# Patient Record
Sex: Male | Born: 1990 | Race: Black or African American | Hispanic: No | Marital: Single | Smoking: Never smoker
Health system: Southern US, Community
[De-identification: ages and names within clinical notes are randomized; demographics above are authoritative.]

## PROBLEM LIST (undated history)

## (undated) DIAGNOSIS — G8220 Paraplegia, unspecified: Secondary | ICD-10-CM

## (undated) DIAGNOSIS — S82899A Other fracture of unspecified lower leg, initial encounter for closed fracture: Secondary | ICD-10-CM

## (undated) DIAGNOSIS — J45909 Unspecified asthma, uncomplicated: Secondary | ICD-10-CM

## (undated) DIAGNOSIS — I749 Embolism and thrombosis of unspecified artery: Secondary | ICD-10-CM

## (undated) HISTORY — PX: NO PAST SURGERIES: SHX2092

## (undated) HISTORY — DX: Paraplegia, unspecified: G82.20

## (undated) HISTORY — DX: Unspecified asthma, uncomplicated: J45.909

## (undated) HISTORY — DX: Embolism and thrombosis of unspecified artery: I74.9

## (undated) HISTORY — DX: Other fracture of unspecified lower leg, initial encounter for closed fracture: S82.899A

---
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
## 2011-09-18 ENCOUNTER — Ambulatory Visit: Payer: Self-pay | Admitting: Rehabilitative and Restorative Service Providers"

## 2011-09-22 ENCOUNTER — Ambulatory Visit (INDEPENDENT_AMBULATORY_CARE_PROVIDER_SITE_OTHER): Payer: Medicaid Other | Admitting: Internal Medicine

## 2011-09-22 ENCOUNTER — Encounter: Payer: Self-pay | Admitting: Internal Medicine

## 2011-09-22 VITALS — BP 126/70 | HR 96 | Temp 98.0°F | Resp 16

## 2011-09-22 DIAGNOSIS — D6859 Other primary thrombophilia: Secondary | ICD-10-CM

## 2011-09-22 DIAGNOSIS — N319 Neuromuscular dysfunction of bladder, unspecified: Secondary | ICD-10-CM

## 2011-09-22 DIAGNOSIS — K592 Neurogenic bowel, not elsewhere classified: Secondary | ICD-10-CM

## 2011-09-22 DIAGNOSIS — G8220 Paraplegia, unspecified: Secondary | ICD-10-CM

## 2011-09-22 DIAGNOSIS — G08 Intracranial and intraspinal phlebitis and thrombophlebitis: Secondary | ICD-10-CM

## 2011-09-22 NOTE — Patient Instructions (Addendum)
Call or return to clinic prn if these symptoms worsen or fail to improve as anticipated.  Please call for a followup office visit in 3 weeks if Medicaid will allow this practice to accept you as a chronic patient

## 2011-09-22 NOTE — Progress Notes (Signed)
Subjective:    Patient ID: Donald Harvey, male    DOB: June 21, 1991, 20 y.o.   MRN: [id number]  HPI  20 year old patient who is seen today to establish with our practice. The patient was in his usual state of excellent health until September 7 when he noticed weakness and numbness involving his lower extremities. 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 At that time he underwent an MRI of the spinal cord that was suggestive of transverse myelitis. He was ultimately transferred to [facility] where he received high-dose steroids for 4 days. He continued to deteriorate with further loss of strength. Evaluation also included an extensive rheumatological panel that was normal;  he underwent plasmapheresis and a followup MRI showed progression of his spinal cord lesion that extended from T1-T11;  at that time a spinal cord vasculopathy was raised and a spinal arteriogram did not show any AV malformations. A CTA of the of the brain however did confirm a right-sided nonocclusive thrombus in the transverse and sphenoid sinus areas and a primary coagulopathy was then considered; an extensive hypercoagulable workup was largely nonrevealing except for a low plasminogen level elevated thrombin clot time and low fibrinogen level;  a hematology workup was negative for malignancy;  this included a testicular ultrasound and a full-body PET scan. The patient was placed on Coumadin anticoagulation on September 15;  evaluation also revealed a transthoracic echo that showed no evidence of thrombi;  he was later  transferred to the [facility]. On September 28 he was readmitted to the acute care hospital due to urosepsis. Blood cultures were positive for Klebsiella. Last week he was discharged from a rehabilitation and [place] and has been living with his mother and a 1 floor apartment here in [place]. He has been on  Coumadin anticoagulation at a dose of 6 mg daily with a therapeutic INR.  Past medical history is otherwise unremarkable  Social history. Prior to his acute illness he was enrolled full-time as a Archivist in [place] no tobacco use and only social alcohol use. Presently lives in a first floor apartment with his mother  Family history multiple family members on his father's side have had clotting issues and multiple family members on blood thinners.  Functional history. At the present time he has only minimal movement of the right leg. He has a T6 sensory level that is improving modestly. He is able to transfer from a wheelchair to bed without difficulty. He does not ambulate. He does require in and out self-catheterization 4 times daily. He has a neurogenic bowel and requires nightly suppositories   Review of Systems  Constitutional: Negative for fever, chills, appetite change and fatigue.  HENT: Negative for hearing loss, ear pain, congestion, sore throat, trouble swallowing, neck stiffness, dental problem, voice change and tinnitus.   Eyes: Negative for pain, discharge and visual disturbance.  Respiratory: Negative for cough, chest tightness, wheezing and stridor.   Cardiovascular: Negative for chest pain, palpitations and leg swelling.  Gastrointestinal: Negative for nausea, vomiting, abdominal pain, diarrhea, constipation, blood in stool and abdominal distention.  Genitourinary: Negative for urgency, hematuria, flank pain, discharge, difficulty urinating and genital sores.  Musculoskeletal: Positive for gait problem. Negative for myalgias, back pain, joint swelling and arthralgias.  Skin: Negative for rash.  Neurological: Negative for dizziness, syncope, speech difficulty, weakness, numbness and headaches.       Paraplegia with T6 sensory level. Minimal  movement right leg only Neurogenic bowel and bladder  Hematological: Negative for adenopathy. Does not bruise/bleed easily.    Psychiatric/Behavioral: Negative for behavioral problems and dysphoric mood. The patient is not nervous/anxious.        Objective:   Physical Exam  Constitutional: He is oriented to person, place, and time. He appears well-developed.       Wheelchair bound  HENT:  Head: Normocephalic.  Right Ear: External ear normal.  Left Ear: External ear normal.  Eyes: Conjunctivae and EOM are normal.  Neck: Normal range of motion.  Cardiovascular: Normal rate and normal heart sounds.   Pulmonary/Chest: Breath sounds normal.  Abdominal: Bowel sounds are normal.  Musculoskeletal: Normal range of motion. He exhibits no edema and no tenderness.  Neurological: He is alert and oriented to person, place, and time.       T6 sensory level Paraplegia with minimal movement right leg   Psychiatric: He has a normal mood and affect. His behavior is normal.        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 We'll continue Coumadin anticoagulation Neurogenic bowel and bladder. We'll continue in and out self bladder catheterizations and nightly suppositorie. He will consider a condom catheter Status post urosepsis  After further investigation it was determined that another practice has been assigned for this individual's care. Apparently this practice will be unable to accept this individual as our patient. The patient and his mother were told that if this could be changed will happily see the patient back at this practice. At the present time it appears this will be a one time visit only.  Will need INR checked in 3 weeks

## 2011-09-28 ENCOUNTER — Ambulatory Visit: Payer: Self-pay | Admitting: Internal Medicine

## 2011-09-30 ENCOUNTER — Ambulatory Visit: Payer: Medicaid Other | Attending: Physical Medicine and Rehabilitation | Admitting: Physical Therapy

## 2011-09-30 DIAGNOSIS — M6281 Muscle weakness (generalized): Secondary | ICD-10-CM | POA: Insufficient documentation

## 2011-09-30 DIAGNOSIS — IMO0001 Reserved for inherently not codable concepts without codable children: Secondary | ICD-10-CM | POA: Insufficient documentation

## 2011-09-30 DIAGNOSIS — R269 Unspecified abnormalities of gait and mobility: Secondary | ICD-10-CM | POA: Insufficient documentation

## 2011-10-05 ENCOUNTER — Ambulatory Visit: Payer: Self-pay | Admitting: Internal Medicine

## 2011-10-08 ENCOUNTER — Ambulatory Visit: Payer: Medicaid Other | Admitting: Physical Therapy

## 2011-10-12 ENCOUNTER — Ambulatory Visit: Payer: Medicaid Other | Attending: Physical Medicine and Rehabilitation | Admitting: Physical Therapy

## 2011-10-12 DIAGNOSIS — R269 Unspecified abnormalities of gait and mobility: Secondary | ICD-10-CM | POA: Insufficient documentation

## 2011-10-12 DIAGNOSIS — IMO0001 Reserved for inherently not codable concepts without codable children: Secondary | ICD-10-CM | POA: Insufficient documentation

## 2011-10-12 DIAGNOSIS — M6281 Muscle weakness (generalized): Secondary | ICD-10-CM | POA: Insufficient documentation

## 2011-10-14 ENCOUNTER — Ambulatory Visit: Payer: Medicaid Other | Admitting: Physical Therapy

## 2011-10-16 ENCOUNTER — Ambulatory Visit: Payer: Medicaid Other | Admitting: Physical Therapy

## 2011-10-19 ENCOUNTER — Ambulatory Visit: Payer: Medicaid Other | Admitting: Physical Therapy

## 2011-10-21 ENCOUNTER — Ambulatory Visit: Payer: Medicaid Other | Admitting: Physical Therapy

## 2011-10-23 ENCOUNTER — Ambulatory Visit: Payer: Medicaid Other | Admitting: Physical Therapy

## 2011-10-28 ENCOUNTER — Ambulatory Visit: Payer: Medicaid Other | Admitting: Physical Therapy

## 2011-10-29 ENCOUNTER — Ambulatory Visit: Payer: Medicaid Other | Admitting: Physical Therapy

## 2011-10-30 ENCOUNTER — Ambulatory Visit: Payer: Medicaid Other | Admitting: Physical Therapy

## 2011-11-10 ENCOUNTER — Telehealth: Payer: Self-pay | Admitting: Internal Medicine

## 2011-11-10 NOTE — Telephone Encounter (Signed)
Letter faxed and mother aware. 

## 2011-11-10 NOTE — Telephone Encounter (Signed)
All ok

## 2011-11-10 NOTE — Telephone Encounter (Signed)
Whittaker's mom is requesting a Disability Letter from Dr. Amador Cunas  to state that Amen is disabled - fax letter to Ms. John Giovanni [phone]. Also requesting a referral for physical therapy for Jham[place] in [place] (Dagon goes to school in [place]).

## 2012-05-31 ENCOUNTER — Telehealth: Payer: Self-pay

## 2012-05-31 NOTE — Telephone Encounter (Signed)
I called the pt mother and she states the pt will be leaving for school on 06-18-12 so he will need an appt before this date. Please advise. Carron Curie, CMA

## 2012-05-31 NOTE — Telephone Encounter (Signed)
Appt set. Pt mother is aware.Carron Curie, CMA

## 2012-05-31 NOTE — Telephone Encounter (Signed)
Monday 06-20-12 at 11am for 1115am allergy consult.

## 2012-05-31 NOTE — Telephone Encounter (Signed)
Please have patient come in on Monday 06-13-12 at 11am for 1115am allergy consult. Thanks.

## 2012-06-13 ENCOUNTER — Other Ambulatory Visit: Payer: Medicaid Other

## 2012-06-13 ENCOUNTER — Encounter: Payer: Self-pay | Admitting: Internal Medicine

## 2012-06-13 ENCOUNTER — Ambulatory Visit (INDEPENDENT_AMBULATORY_CARE_PROVIDER_SITE_OTHER): Payer: Medicaid Other | Admitting: Internal Medicine

## 2012-06-13 VITALS — BP 128/70 | HR 90 | Ht 74.0 in

## 2012-06-13 DIAGNOSIS — Z91018 Allergy to other foods: Secondary | ICD-10-CM

## 2012-06-13 DIAGNOSIS — D6859 Other primary thrombophilia: Secondary | ICD-10-CM

## 2012-06-13 DIAGNOSIS — J309 Allergic rhinitis, unspecified: Secondary | ICD-10-CM

## 2012-06-13 DIAGNOSIS — T781XXA Other adverse food reactions, not elsewhere classified, initial encounter: Secondary | ICD-10-CM

## 2012-06-13 NOTE — Progress Notes (Signed)
06/13/12- 21 yoM  Never smoker, paraplegic, w/ hx thrombosis spinal cord/ coumadin.  Coming now for allergy evaluation. Self referral-never had skin test or allergy lab work-college student. No PCP- uses urgent care on [address]. Complains of occasional lightheadedness, perennial, since he was a child. Not a family trait. He can't relate this to any specific exposure. Diagnosed with asthma as a child but never hospitalized. Denies nasal congestion, itching or sneezing, watery eyes or wheezing. Denies skin rash. Shellfish may cause headache and rash. 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  Prior to Admission medications   Medication Sig Start Date End Date Taking? Authorizing Provider  warfarin (COUMADIN) 5 MG tablet Take as directed per clinic on [address]   Yes Historical Provider, MD   Past Medical History  Diagnosis Date  . Childhood asthma   . Embolism - blood clot   . Spinal paraplegia    History reviewed. No pertinent past surgical history. History reviewed. No pertinent family history. History   Social History  . Marital Status: Single    Spouse Name: N/A    Number of Children: N/A  . Years of Education: N/A   Occupational History  . Not on file.   Social History Main Topics  . Smoking status: Never Smoker   . Smokeless tobacco: Never Used  . Alcohol Use: Yes  . Drug Use: No  . Sexually Active: Not on file   Other Topics Concern  . Not on file   Social History Narrative  . No narrative on file   ROS-see HPI Constitutional:   No-   weight loss, night sweats, fevers, chills, fatigue, lassitude. HEENT:   No-  headaches, difficulty swallowing, tooth/dental problems, sore throat,       No-  sneezing, itching, ear ache, nasal congestion, post nasal drip,  CV:  No-   chest pain, orthopnea, PND, swelling in lower extremities, anasarca,  dizziness,  palpitations Resp: No-   shortness of breath with exertion or at rest.              No-   productive cough,  No non-productive cough,  No- coughing up of blood.              No-   change in color of mucus.  No- wheezing.   Skin: No-   rash or lesions. GI:  No-   heartburn, indigestion, abdominal pain, nausea, vomiting, diarrhea,                 change in bowel habits, loss of appetite GU: No-   dysuria, change in color of urine, no urgency or frequency.  No- flank pain. MS:  No-   joint pain or swelling.  No- decreased range of motion.  No- back pain. Neuro-     T-6 paraplegia Psych:  No- change in mood or affect. No depression or anxiety.  No memory loss.  OBJ- Physical Exam General- Alert, Oriented, Affect-appropriate, Distress- none acute. Wheelchair Skin- rash-none, lesions- none, excoriation- none Lymphadenopathy- none Head- atraumatic            Eyes- Gross vision intact, PERRLA, conjunctivae and secretions clear            Ears- Hearing, canals-normal            Nose- Clear, no-Septal dev, mucus, polyps, erosion, perforation  Throat- Mallampati II , mucosa clear , drainage- none, tonsils- atrophic Neck- flexible , trachea midline, no stridor , thyroid nl, carotid no bruit Chest - symmetrical excursion , unlabored           Heart/CV- RRR , no murmur , no gallop  , no rub, nl s1 s2                           - JVD- none , edema- none, stasis changes- none, varices- none           Lung- shallow/clear to P&A, wheeze- none, cough- none , dullness-none, rub- none           Chest wall-  Abd- tender-no, distended-no, bowel sounds-present, HSM- no Br/ Gen/ Rectal- Not done, not indicated Extrem- cyanosis- none, clubbing, none, atrophy- none, strength- nl Neuro- grossly intact to observation

## 2012-06-13 NOTE — Patient Instructions (Addendum)
Order- lab- Allergy profile and Food allergy profile   Dx Allergic rhinitis, food allergy  Consider the dust and pollen environmental information I gave you.

## 2012-06-19 DIAGNOSIS — Z91018 Allergy to other foods: Secondary | ICD-10-CM | POA: Insufficient documentation

## 2012-06-19 NOTE — Assessment & Plan Note (Addendum)
He is careful to avoid problem foods. His more vague symptoms are harder to define as allergy. He is leaving to go back to school in [place] and only a few days. Plan-lab allergy profiles. I gave information on environmental dust and allergen control pertinent to his living quarters at school.

## 2012-06-20 LAB — ALLERGY FULL AND FOOD SPECIFIC PROFILE
Allergen,Goose feathers, e70: <0.1 kU/L
Alternaria Alternata: <0.1 kU/L
Aspergillus fumigatus, IgG: 1.54 kU/L — ABNORMAL HIGH
Common Ragweed: 0.37 kU/L — ABNORMAL HIGH
Dog Dander: <0.1 kU/L
Egg White IgE: <0.1 kU/L
Goldenrod: 0.2 kU/L — ABNORMAL HIGH
Helminthosporium halodes: 2.65 kU/L — ABNORMAL HIGH
House Dust Hollister: <0.1 kU/L
IgE (Immunoglobulin E), Serum: 104.9 IU/mL (ref 0.0–180.0)
Lamb's Quarters: 0.19 kU/L — ABNORMAL HIGH
Milk IgE: <0.1 kU/L
Peanut IgE: 0.19 kU/L — ABNORMAL HIGH
Plantain: 0.47 kU/L — ABNORMAL HIGH
Shrimp IgE: 11.8 kU/L — ABNORMAL HIGH
Soybean IgE: 0.11 kU/L — ABNORMAL HIGH
Stemphylium Botryosum: 0.11 kU/L — ABNORMAL HIGH
Tomato IgE: <0.1 kU/L
Tuna IgE: <0.1 kU/L
Wheat IgE: 0.42 kU/L — ABNORMAL HIGH

## 2013-03-15 ENCOUNTER — Other Ambulatory Visit (INDEPENDENT_AMBULATORY_CARE_PROVIDER_SITE_OTHER): Payer: BC Managed Care – PPO

## 2013-03-15 DIAGNOSIS — Z0000 Encounter for general adult medical examination without abnormal findings: Secondary | ICD-10-CM

## 2013-03-15 LAB — BASIC METABOLIC PANEL
BUN: 13 mg/dL (ref 6–23)
GFR: 120.09 mL/min (ref >60.00)
Glucose, Bld: 86 mg/dL (ref 70–99)
Potassium: 4.1 mEq/L (ref 3.5–5.1)

## 2013-03-15 LAB — POCT URINALYSIS DIPSTICK
Bilirubin, UA: NEGATIVE
Blood, UA: NEGATIVE
Ketones, UA: NEGATIVE
Nitrite, UA: NEGATIVE
pH, UA: 7.5

## 2013-03-15 LAB — CBC WITH DIFFERENTIAL/PLATELET
Basophils Relative: 0.6 % (ref 0.0–3.0)
Eosinophils Relative: 3 % (ref 0.0–5.0)
HCT: 47.4 % (ref 39.0–52.0)
Hemoglobin: 16.2 g/dL (ref 13.0–17.0)
MCV: 94.1 fl (ref 78.0–100.0)
Monocytes Absolute: 0.3 10*3/uL (ref 0.1–1.0)
Neutrophils Relative %: 55.9 % (ref 43.0–77.0)
RBC: 5.04 Mil/uL (ref 4.22–5.81)
WBC: 3.8 10*3/uL — ABNORMAL LOW (ref 4.5–10.5)

## 2013-03-15 LAB — HEPATIC FUNCTION PANEL
ALT: 20 U/L (ref 0–53)
AST: 16 U/L (ref 0–37)
Alkaline Phosphatase: 46 U/L (ref 39–117)
Bilirubin, Direct: 0 mg/dL (ref 0.0–0.3)
Total Protein: 6.9 g/dL (ref 6.0–8.3)

## 2013-03-15 LAB — LIPID PANEL: VLDL: 19.8 mg/dL (ref 0.0–40.0)

## 2013-03-23 ENCOUNTER — Ambulatory Visit (INDEPENDENT_AMBULATORY_CARE_PROVIDER_SITE_OTHER): Payer: BC Managed Care – PPO | Admitting: Internal Medicine

## 2013-03-23 ENCOUNTER — Encounter: Payer: Self-pay | Admitting: Internal Medicine

## 2013-03-23 VITALS — BP 120/80 | Temp 98.3°F | Ht 74.0 in | Wt 194.0 lb

## 2013-03-23 DIAGNOSIS — N319 Neuromuscular dysfunction of bladder, unspecified: Secondary | ICD-10-CM

## 2013-03-23 DIAGNOSIS — Z23 Encounter for immunization: Secondary | ICD-10-CM

## 2013-03-23 DIAGNOSIS — D6859 Other primary thrombophilia: Secondary | ICD-10-CM

## 2013-03-23 DIAGNOSIS — G8220 Paraplegia, unspecified: Secondary | ICD-10-CM

## 2013-03-23 DIAGNOSIS — K592 Neurogenic bowel, not elsewhere classified: Secondary | ICD-10-CM

## 2013-03-23 MED ORDER — WARFARIN SODIUM 5 MG PO TABS: ORAL_TABLET | ORAL | Status: DC

## 2013-03-23 NOTE — Progress Notes (Signed)
Subjective:    Patient ID: Donald Harvey, male    DOB: 1991/09/27, 22 y.o.   MRN: [id number]  HPI  HPI  22 year-old patient who is seen today for a preventive health exam. He established with our practice approximately 2 years ago but has not been seen in followup until today.  The patient was in his usual state of excellent health until July 10, 2011  when he noticed weakness and numbness involving his lower extremities. 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 At that time he underwent an MRI of the spinal cord that was suggestive of transverse myelitis. He was ultimately transferred to [facility] where he received high-dose steroids for 4 days. He continued to deteriorate with further loss of strength. Evaluation also included an extensive rheumatological panel that was normal; he underwent plasmapheresis and a followup MRI showed progression of his spinal cord lesion that extended from T1-T11; at that time a spinal cord vasculopathy was raised and a spinal arteriogram did not show any AV malformations. A CTA of the of the brain however did confirm a right-sided nonocclusive thrombus in the transverse and sphenoid sinus areas and a primary coagulopathy was then considered; an extensive hypercoagulable workup was largely nonrevealing except for a low plasminogen level elevated thrombin clot time and low fibrinogen level; a hematology workup was negative for malignancy; this included a testicular ultrasound and a full-body PET scan. The patient was placed on Coumadin anticoagulation on September 15; evaluation also revealed a transthoracic echo that showed no evidence of thrombi; he was later transferred to the [facility].  Since his last visit here he has returned to school at [employer] and is pursuing a degree in Lobbyist.  He has been treated with chronic Coumadin  anticoagulation for his hypercoagulable state. He has not taken his medication regularly nor has he been followed with INRs  Past medical history is otherwise unremarkable   Social history. 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 Presently lives in a first floor apartment with his mother  and is home for the summer but plans to return to [employer] in the fall for his final year  Family history:  multiple family members on his father's side have had clotting issues and multiple family members on blood thinners.   Functional history. Over the past 2 years his paraplegia has improved. He has had  a T6 sensory level that has  improved. He is able to transfer from a wheelchair to bed without difficulty. He now is able to ambulate with a walker.  He does have left foot /ankle braces . He does require in and out self-catheterization 4 times daily. He has a neurogenic bowel and requires nightly suppositories and occasional digital disimpaction.       Review of Systems  Constitutional: Negative for fever, chills, appetite change and fatigue.  HENT: Negative for hearing loss, ear pain, congestion, sore throat, trouble swallowing, neck stiffness, dental problem, voice change and tinnitus.   Eyes: Negative for pain, discharge and visual disturbance.  Respiratory: Negative for cough, chest tightness, wheezing and stridor.   Cardiovascular: Negative for chest pain, palpitations and leg swelling.  Gastrointestinal: Positive for constipation. Negative for nausea, vomiting, abdominal pain, diarrhea, blood in stool and abdominal distention.  Genitourinary: Positive for difficulty urinating. Negative for  urgency, hematuria, flank pain, discharge and genital sores.  Musculoskeletal: Positive for gait problem. Negative for myalgias, back pain, joint swelling and arthralgias.  Skin: Negative for rash.  Neurological:  Positive for weakness. Negative for dizziness, syncope, speech difficulty, numbness and headaches.  Hematological: Negative for adenopathy. Does not bruise/bleed easily.  Psychiatric/Behavioral: Negative for behavioral problems and dysphoric mood. The patient is not nervous/anxious.        Objective:   Physical Exam  Constitutional: He is oriented to person, place, and time. He appears well-developed and well-nourished.  HENT:  Head: Normocephalic and atraumatic.  Right Ear: External ear normal.  Left Ear: External ear normal.  Nose: Nose normal.  Mouth/Throat: Oropharynx is clear and moist.  Eyes: Conjunctivae and EOM are normal. Pupils are equal, round, and reactive to light. No scleral icterus.  Neck: Normal range of motion. Neck supple. No JVD present. No thyromegaly present.  Cardiovascular: Regular rhythm, normal heart sounds and intact distal pulses.  Exam reveals no gallop and no friction rub.   No murmur heard. Pulmonary/Chest: Effort normal and breath sounds normal. He exhibits no tenderness.  Abdominal: Soft. Bowel sounds are normal. He exhibits no distension and no mass. There is no tenderness.  Genitourinary: Prostate normal and penis normal.  Musculoskeletal: Normal range of motion. He exhibits no edema and no tenderness.  Lymphadenopathy:    He has no cervical adenopathy.  Neurological: He is alert and oriented to person, place, and time. He displays abnormal reflex. No cranial nerve deficit. He exhibits abnormal muscle tone. Coordination abnormal.  Spastic lower extremity weakness the left leg greater than the right; inversion left foot with inability to dorsiflex or plantar flex foot; Able to raise the right leg off the examining table and hold against gravity  Hyperreflexia lower extremities  Sensation grossly intact  Skin: Skin is warm and dry. No rash noted.  Psychiatric: He has a normal mood and affect. His behavior is normal. Judgment and thought content normal.           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 The patient has improved considerably over the past year and a half and may benefit with some additional physical therapy over the summer. He plans to return to school in the fall   Recommended that the patient resume the Coumadin anticoagulation. He is aware of the necessity to track INR s on a monthly basis and he seems agreeable.

## 2013-03-23 NOTE — Patient Instructions (Addendum)
Rehabilitation consult as discussed  Resume Coumadin anticoagulation  Monthly INRs

## 2013-03-24 ENCOUNTER — Encounter: Payer: BC Managed Care – PPO | Admitting: Internal Medicine

## 2013-04-05 ENCOUNTER — Ambulatory Visit: Payer: BC Managed Care – PPO | Admitting: Physical Therapy

## 2013-04-05 ENCOUNTER — Ambulatory Visit
Payer: BC Managed Care – PPO | Attending: Internal Medicine | Admitting: Rehabilitative and Restorative Service Providers"

## 2013-04-05 DIAGNOSIS — R269 Unspecified abnormalities of gait and mobility: Secondary | ICD-10-CM | POA: Insufficient documentation

## 2013-04-05 DIAGNOSIS — M6281 Muscle weakness (generalized): Secondary | ICD-10-CM | POA: Insufficient documentation

## 2013-04-05 DIAGNOSIS — IMO0001 Reserved for inherently not codable concepts without codable children: Secondary | ICD-10-CM | POA: Insufficient documentation

## 2013-04-10 ENCOUNTER — Ambulatory Visit: Payer: BC Managed Care – PPO | Admitting: Physical Therapy

## 2013-04-12 ENCOUNTER — Telehealth: Payer: Self-pay | Admitting: Internal Medicine

## 2013-04-12 ENCOUNTER — Ambulatory Visit: Payer: BC Managed Care – PPO | Admitting: Rehabilitative and Restorative Service Providers"

## 2013-04-12 NOTE — Telephone Encounter (Signed)
I called to offer patient an OV with CY to discuss his concerns as he was last seen 06-2012; pt refused Friday 04-14-13 appointment as he had other obligations. Pt stated he would need to check his schedule to be able to come see CY. In the meantime, CY please advise if any other recommendations you have for patient. Thanks.

## 2013-04-13 NOTE — Telephone Encounter (Signed)
He is paraplegic and not very mobile. If an otc antihistamine like Claritin or Allegra is insufficient, then we could add Rx for him to try Flonase nasal spray  # 1-2 puffs each nostril once daily at bedtime. Refill prn.

## 2013-04-13 NOTE — Telephone Encounter (Signed)
Spoke with pt and notified of recs per CDY He verbalized understanding  States that allergies "arent that bad" and if needed with try otc claritin or allegra first and call back if needed Nothing further needed per pt

## 2013-04-19 ENCOUNTER — Ambulatory Visit: Payer: BC Managed Care – PPO | Admitting: Rehabilitative and Restorative Service Providers"

## 2013-04-20 ENCOUNTER — Ambulatory Visit: Payer: BC Managed Care – PPO | Admitting: Rehabilitative and Restorative Service Providers"

## 2013-04-24 ENCOUNTER — Ambulatory Visit: Payer: BC Managed Care – PPO

## 2013-04-26 ENCOUNTER — Encounter: Payer: Self-pay | Admitting: Physical Medicine & Rehabilitation

## 2013-04-28 ENCOUNTER — Telehealth: Payer: Self-pay | Admitting: Family

## 2013-04-28 ENCOUNTER — Ambulatory Visit: Payer: BC Managed Care – PPO | Admitting: Physical Therapy

## 2013-04-28 NOTE — Telephone Encounter (Signed)
Where is he having his INR managed??

## 2013-04-28 NOTE — Progress Notes (Signed)
Quick Note:  See phone note dated 04-12-13 pertaining to this result. ______

## 2013-04-28 NOTE — Telephone Encounter (Signed)
Pt has not been having coumadin managed.  Pt asked that I call back on Tuesday to get him scheduled for an appointment. He has to get a ride

## 2013-05-01 ENCOUNTER — Ambulatory Visit: Payer: BC Managed Care – PPO | Admitting: Physical Therapy

## 2013-05-02 ENCOUNTER — Ambulatory Visit: Payer: BC Managed Care – PPO | Attending: Internal Medicine | Admitting: Physical Therapy

## 2013-05-02 DIAGNOSIS — M6281 Muscle weakness (generalized): Secondary | ICD-10-CM | POA: Insufficient documentation

## 2013-05-02 DIAGNOSIS — R269 Unspecified abnormalities of gait and mobility: Secondary | ICD-10-CM | POA: Insufficient documentation

## 2013-05-02 DIAGNOSIS — IMO0001 Reserved for inherently not codable concepts without codable children: Secondary | ICD-10-CM | POA: Insufficient documentation

## 2013-05-02 NOTE — Telephone Encounter (Signed)
Left message for pt to call back to schedule appointment for coumadin manangement

## 2013-05-03 ENCOUNTER — Ambulatory Visit: Payer: BC Managed Care – PPO | Admitting: Physical Therapy

## 2013-05-08 ENCOUNTER — Ambulatory Visit: Payer: BC Managed Care – PPO | Admitting: Rehabilitative and Restorative Service Providers"

## 2013-05-10 ENCOUNTER — Ambulatory Visit: Payer: BC Managed Care – PPO | Admitting: Rehabilitative and Restorative Service Providers"

## 2013-05-31 ENCOUNTER — Encounter
Payer: BC Managed Care – PPO | Attending: Physical Medicine & Rehabilitation | Admitting: Physical Medicine & Rehabilitation

## 2013-05-31 ENCOUNTER — Encounter: Payer: Self-pay | Admitting: Physical Medicine & Rehabilitation

## 2013-05-31 VITALS — BP 129/74 | HR 61 | Resp 16 | Ht 75.0 in

## 2013-05-31 DIAGNOSIS — K592 Neurogenic bowel, not elsewhere classified: Secondary | ICD-10-CM

## 2013-05-31 DIAGNOSIS — Z5181 Encounter for therapeutic drug level monitoring: Secondary | ICD-10-CM

## 2013-05-31 DIAGNOSIS — I69959 Hemiplegia and hemiparesis following unspecified cerebrovascular disease affecting unspecified side: Secondary | ICD-10-CM | POA: Insufficient documentation

## 2013-05-31 DIAGNOSIS — G8389 Other specified paralytic syndromes: Secondary | ICD-10-CM

## 2013-05-31 DIAGNOSIS — Z7901 Long term (current) use of anticoagulants: Secondary | ICD-10-CM | POA: Insufficient documentation

## 2013-05-31 DIAGNOSIS — IMO0002 Reserved for concepts with insufficient information to code with codable children: Secondary | ICD-10-CM

## 2013-05-31 DIAGNOSIS — G9511 Acute infarction of spinal cord (embolic) (nonembolic): Secondary | ICD-10-CM

## 2013-05-31 DIAGNOSIS — G9519 Other vascular myelopathies: Secondary | ICD-10-CM

## 2013-05-31 DIAGNOSIS — N319 Neuromuscular dysfunction of bladder, unspecified: Secondary | ICD-10-CM

## 2013-05-31 NOTE — Progress Notes (Signed)
Subjective:    Patient ID: Donald Harvey, male    DOB: 1991-03-09, 22 y.o.   MRN: [id number]  HPI This is an initial spasticity evaluation for Donald Harvey, who was in his usual state of excellent health until July 10, 2011 when he noticed weakness and numbness involving his lower extremities. 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 At that time he underwent an MRI of the spinal cord that was suggestive of transverse myelitis. He was ultimately transferred to [facility] where he received high-dose steroids for 4 days. He continued to deteriorate with further loss of strength. Evaluation also included an extensive rheumatological panel that was normal; he underwent plasmapheresis and a followup MRI showed progression of his spinal cord lesion that extended from T1-T11; at that time a spinal cord vasculopathy was raised and a spinal arteriogram did not show any AV malformations. A CTA of the of the brain however did confirm a right-sided nonocclusive thrombus in the transverse and sphenoid sinus areas and a primary coagulopathy was then considered; an extensive hypercoagulable workup was largely nonrevealing except for a low plasminogen level elevated thrombin clot time and low fibrinogen level; a hematology workup was negative for malignancy; this included a testicular ultrasound and a full-body PET scan. He remains on chronic coumadin  He struggles with spasticity in from his waist down to his feet. The tone is worst in his left lower ext. The spasms are worst when he lays flat or the days when he has a bm. He uses digital stim sometimes to help his bowel movement. He straight cath to empty his bladder. He can feel his bladder filling up but he is unable to empty.   He has decreased PP/LT in the right leg and the left leg is essentially normal for sensation. He walks up steps to get in his house but otherwise he  uses a wheelchair for mobility.   Donald Harvey is on no medications for spasiticty nor does he recall being on any before.   He is in school for Designer, fashion/clothing at [employer]. He plans on completing his degree over the next year or so. He goes back to school in a couple weeks.   Pain Inventory Average Pain 0 Pain Right Now 0 My pain is na  In the last 24 hours, has pain interfered with the following? General activity 0 Relation with others 0 Enjoyment of life 0 What TIME of day is your pain at its worst? na Sleep (in general) Good  Pain is worse with: na Pain improves with: rest Relief from Meds: 0  Mobility use a walker how many minutes can you walk? not sure ability to climb steps?  yes do you drive?  no use a wheelchair transfers alone Do you have any goals in this area?  yes  Function not employed: date last employed na Do you have any goals in this area?  yes  Neuro/Psych bladder control problems spasms  Prior Studies Any changes since last visit?  no  Physicians involved in your care Any changes since last visit?  yes Primary care Dr.  Corinda Harvey    History reviewed. No pertinent family history. History   Social History  . Marital Status: Single    Spouse Name: N/A    Number of Children: N/A  . Years of Education: N/A   Social History Main Topics  . Smoking status: Never Smoker   .  Smokeless tobacco: Never Used  . Alcohol Use: Yes  . Drug Use: No  . Sexually Active: None   Other Topics Concern  . None   Social History Narrative  . None   History reviewed. No pertinent past surgical history. Past Medical History  Diagnosis Date  . Childhood asthma   . Embolism - blood clot   . Spinal paraplegia    BP 129/74  Pulse 61  Resp 16  Ht 6\' 3"  (1.905 m)  SpO2 100%     Review of Systems  Constitutional: Positive for diaphoresis.  Genitourinary:       Bladder control problems  Neurological:       Spasms  All other systems reviewed  and are negative.       Objective:   Physical Exam  General: Alert and oriented x 3, No apparent distress HEENT: Head is normocephalic, atraumatic, PERRLA, EOMI, sclera anicteric, oral mucosa pink and moist, dentition intact, ext ear canals clear,  Neck: Supple without JVD or lymphadenopathy Heart: Reg rate and rhythm. No murmurs rubs or gallops Chest: CTA bilaterally without wheezes, rales, or rhonchi; no distress Abdomen: Soft, non-tender, non-distended, bowel sounds positive. Extremities: No clubbing, cyanosis, or edema. Pulses are 2+ Skin: Clean and intact without signs of breakdown Neuro: Pt is cognitively appropriate with normal insight, memory, and awareness. Cranial nerves 2-12 are intact. Diminished to LT and PP particularly in the right leg. He does have a lower abdominal sensory level more so ln the right. Left leg was fairly unaffected. Reflexes are 2+ in the Uppers. He is 2++ in the RLE and 3++ LLE. He has sustained clonus at the left ankle, none on the right. He has 3-4/4 tone along the gastroc, post-tib and tib anterior with equinovarus deformity noted. UES is 5/5. RLE is 4 to 4+/5 with decreased FMC. LLE is 1-2 with HF, HE was 1. KE 1-2, ADF and APF essentially trace to 1. he was able to stand with assist of one or use of a stationary object (chair). With standing he goes into recurvatum at the left knee and the extension of the knee further exacerbates his equinovarus deformity. Musculoskeletal: Full ROM, No pain with AROM or PROM in the neck, trunk, or extremities. Posture appropriate Psych: Pt's affect is appropriate. Pt is cooperative. He is a pleasant young man.         Assessment & Plan:  1. 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 The distal left lower extremity is most profoundly affected from a spasticity and motor standpoint.  Plan: 1. I believe that [name] would be appropriate for a trial of botox  injections to include potentionally the gastrocs, tib posterior, and tib anterior. This should be followed by a period of aggressive stretching and exercise.  2. We discussed the possibility of using an anti-spasmodic medication, but given the focal area of involvement and the fact that he is an active young man going to college, I would hate to subject him to the systemic side effects. 3. The other challenge here is the time factor. We will try to get him approved for injections and perform them before he leaves for [facility]. Our office has already begun the process. 4. I will see him back in 2 weeks. 30 minutes of face to face patient care time were spent during this visit. All questions were encouraged and answered.   I appreciate the opportunity to be involved in the care  of this pleasant young man!

## 2013-05-31 NOTE — Patient Instructions (Signed)
CALL ME WITH ANY PROBLEMS OR QUESTIONS (#[phone]).  HAVE A GOOD DAY  

## 2013-05-31 NOTE — Addendum Note (Signed)
Addended by: Doreene Eland on: 05/31/2013 02:02 PM   Modules accepted: Orders

## 2013-06-05 ENCOUNTER — Ambulatory Visit
Payer: BC Managed Care – PPO | Attending: Internal Medicine | Admitting: Rehabilitative and Restorative Service Providers"

## 2013-06-05 DIAGNOSIS — IMO0001 Reserved for inherently not codable concepts without codable children: Secondary | ICD-10-CM | POA: Insufficient documentation

## 2013-06-05 DIAGNOSIS — R269 Unspecified abnormalities of gait and mobility: Secondary | ICD-10-CM | POA: Insufficient documentation

## 2013-06-05 DIAGNOSIS — M6281 Muscle weakness (generalized): Secondary | ICD-10-CM | POA: Insufficient documentation

## 2013-06-06 ENCOUNTER — Telehealth: Payer: Self-pay | Admitting: Family

## 2013-06-06 ENCOUNTER — Other Ambulatory Visit: Payer: Self-pay | Admitting: Family

## 2013-06-06 NOTE — Telephone Encounter (Signed)
Please call and see if patient is taking coumadin any longer. He has not been seen here in 1.5 years.

## 2013-06-06 NOTE — Telephone Encounter (Signed)
Spoke with pt. He has not been taking coumadin because he says that he feels "more normal" when he doesn't take it.   Padonda aware. Will send to PCP as an [place]

## 2013-06-07 ENCOUNTER — Ambulatory Visit: Payer: BC Managed Care – PPO | Admitting: Rehabilitative and Restorative Service Providers"

## 2013-06-12 ENCOUNTER — Ambulatory Visit: Payer: BC Managed Care – PPO | Admitting: Physical Therapy

## 2013-06-13 ENCOUNTER — Ambulatory Visit: Payer: BC Managed Care – PPO | Admitting: Physical Therapy

## 2013-06-14 ENCOUNTER — Encounter: Payer: Self-pay | Admitting: Physical Medicine & Rehabilitation

## 2013-06-14 ENCOUNTER — Encounter
Payer: BC Managed Care – PPO | Attending: Physical Medicine & Rehabilitation | Admitting: Physical Medicine & Rehabilitation

## 2013-06-14 VITALS — BP 133/73 | HR 67 | Resp 14 | Ht 74.0 in | Wt 194.0 lb

## 2013-06-14 DIAGNOSIS — I69959 Hemiplegia and hemiparesis following unspecified cerebrovascular disease affecting unspecified side: Secondary | ICD-10-CM | POA: Insufficient documentation

## 2013-06-14 DIAGNOSIS — Z7901 Long term (current) use of anticoagulants: Secondary | ICD-10-CM | POA: Insufficient documentation

## 2013-06-14 DIAGNOSIS — G9511 Acute infarction of spinal cord (embolic) (nonembolic): Secondary | ICD-10-CM

## 2013-06-14 DIAGNOSIS — IMO0002 Reserved for concepts with insufficient information to code with codable children: Secondary | ICD-10-CM

## 2013-06-14 DIAGNOSIS — G9519 Other vascular myelopathies: Secondary | ICD-10-CM

## 2013-06-14 DIAGNOSIS — G8389 Other specified paralytic syndromes: Secondary | ICD-10-CM

## 2013-06-14 MED ORDER — BACLOFEN 10 MG PO TABS: 10.0000 mg | ORAL_TABLET | Freq: Three times a day (TID) | ORAL | Status: DC | PRN

## 2013-06-14 NOTE — Progress Notes (Signed)
Botox Injection for spasticity using needle EMG guidance  Dilution: 50 Units/ml Indication: Severe spasticity which interferes with ADL,mobility and/or  hygiene and is unresponsive to medication management and other conservative care Informed consent was obtained after describing risks and benefits of the procedure with the patient. This includes bleeding, bruising, infection, excessive weakness, or medication side effects. A REMS form is on file and signed. Needle: 50mm monopolar injectable needle electrode Number of units per muscle  Gastrocs: 50 u each head, 4 access points Tibialis Posterior 150 units 4 different access points with needle redirection Tibialis Anterior 50 units 2 different access points  All injections were done after obtaining appropriate EMG activity and after negative drawback for blood. The patient tolerated the procedure well. Post procedure instructions were given. A followup appointment was made.   I will see the patient back over the holidays to judge effect and need for further injections. The patient was advised to call me with any questions. We may need to schedule another injection at that time depending upon response. Additionally i wrote him an rx for baclofen 10mg  q8 prn spasms. Can work up to scheduled dosing as needed.

## 2013-06-14 NOTE — Patient Instructions (Signed)
WE CAN SCHEDULE ANOTHER BOTOX INJECTION IF NEED BE PRIOR TO YOU COMING BACK IN FOLLOW UP SO THAT IT CAN BE PERFORMED AT THAT TIME. THIS OBVIOUSLY WILL DEPEND UPON YOUR RESPONSE TO THE INJECTION.   CALL ME WITH ANY PROBLEMS OR QUESTIONS (#[phone]).  HAVE A GOOD DAY

## 2013-10-23 ENCOUNTER — Encounter: Payer: Self-pay | Admitting: Physical Medicine & Rehabilitation

## 2013-10-23 ENCOUNTER — Encounter
Payer: BC Managed Care – PPO | Attending: Physical Medicine & Rehabilitation | Admitting: Physical Medicine & Rehabilitation

## 2013-10-23 VITALS — BP 136/70 | HR 79 | Resp 14 | Ht 75.0 in | Wt 187.0 lb

## 2013-10-23 DIAGNOSIS — G8389 Other specified paralytic syndromes: Secondary | ICD-10-CM

## 2013-10-23 DIAGNOSIS — G9519 Other vascular myelopathies: Secondary | ICD-10-CM

## 2013-10-23 DIAGNOSIS — I69959 Hemiplegia and hemiparesis following unspecified cerebrovascular disease affecting unspecified side: Secondary | ICD-10-CM | POA: Insufficient documentation

## 2013-10-23 DIAGNOSIS — IMO0002 Reserved for concepts with insufficient information to code with codable children: Secondary | ICD-10-CM

## 2013-10-23 DIAGNOSIS — G9511 Acute infarction of spinal cord (embolic) (nonembolic): Secondary | ICD-10-CM

## 2013-10-23 NOTE — Progress Notes (Signed)
Subjective:    Patient ID: Donald Harvey, male    DOB: [birth year], 22 y.o.   MRN: [id number]  HPI  Donald Harvey is back regarding his spastic paraplegia. He had  Positive results  With the botox in that they allowed better ROM and stretching of his foot. They also helped for a month or two with his gait. He feels that at this point he's approaching his baseline again although the foot may be a little more flexible. He occasionally will take a baclofen for spasms.  He has noticed some sweating at night time. He denies any skin issues, bladder or bowel problems, infections, etc.  Pain Inventory Average Pain 0 Pain Right Now 0 My pain is no pain  In the last 24 hours, has pain interfered with the following? General activity 0 Relation with others 0 Enjoyment of life 0 What TIME of day is your pain at its worst? no pain Sleep (in general) Good  Pain is worse with: no pain Pain improves with: no pain Relief from Meds: no pain meds  Mobility walk with assistance use a walker how many minutes can you walk? not sure ability to climb steps?  yes do you drive?  no use a wheelchair Do you have any goals in this area?  yes  Function not employed: date last employed na Do you have any goals in this area?  yes  Neuro/Psych No problems in this area  Prior Studies Any changes since last visit?  no  Physicians involved in your care Any changes since last visit?  no   History reviewed. No pertinent family history. History   Social History  . Marital Status: Single    Spouse Name: N/A    Number of Children: N/A  . Years of Education: N/A   Social History Main Topics  . Smoking status: Never Smoker   . Smokeless tobacco: Never Used  . Alcohol Use: Yes  . Drug Use: No  . Sexual Activity: None   Other Topics Concern  . None   Social History Narrative  . None   History reviewed. No pertinent past surgical history. Past Medical History  Diagnosis Date  . Childhood  asthma   . Embolism - blood clot   . Spinal paraplegia    BP 136/70  Pulse 79  Resp 14  Ht 6\' 3"  (1.905 m)  Wt 187 lb (84.823 kg)  BMI 23.37 kg/m2  SpO2 98%      Review of Systems  All other systems reviewed and are negative.       Objective:   Physical Exam General: Alert and oriented x 3, No apparent distress  HEENT: Head is normocephalic, atraumatic, PERRLA, EOMI, sclera anicteric, oral mucosa pink and moist, dentition intact, ext ear canals clear,  Neck: Supple without JVD or lymphadenopathy  Heart: Reg rate and rhythm. No murmurs rubs or gallops  Chest: CTA bilaterally without wheezes, rales, or rhonchi; no distress  Abdomen: Soft, non-tender, non-distended, bowel sounds positive.  Extremities: No clubbing, cyanosis, or edema. Pulses are 2+  Skin: Clean and intact without signs of breakdown  Neuro: Pt is cognitively appropriate with normal insight, memory, and awareness. Cranial nerves 2-12 are intact. Diminished to LT and PP particularly in the right leg. He does have a lower abdominal sensory level more so ln the right. Left leg was fairly unaffected. Reflexes are 2+ in the Uppers. He is 2++ in the RLE and 3++ LLE. He has sustained clonus at the left  ankle, none on the right. He has 3-4/4 tone along the gastroc, post-tib and tib anterior with equinovarus deformity noted. UES is 5/5. RLE is 4 to 4+/5 with decreased FMC. LLE is 1-2 with HF, HE was 1. KE 1-2, ADF and APF essentially trace to 1. he was able to stand with assist of one or use of a stationary object (chair). With standing he goes into recurvatum at the left knee and the extension of the knee further exacerbates his equinovarus deformity.  Musculoskeletal: Full ROM, No pain with AROM or PROM in the neck, trunk, or extremities. Posture appropriate  Psych: Pt's affect is appropriate. Pt is cooperative. He is a pleasant young man.    Assessment & Plan:   1. 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 He has  the presentation of a Brown Sequard Syndrome. The distal left lower extremity is most profoundly affected from a spasticity and motor standpoint.    Plan:  1.We will retry botox at 400 units to the left gastroc, soleus, tibialis posterior and potentially the tib anterior. I think it's worth at least one more trial as he did have benefit with the last injections.  2. Baclofen prn  3.  I will see him back in about 2 weeks before he heads back to school.  30 minutes of face to face patient care time were spent during this visit. All questions were encouraged and answered.

## 2013-10-23 NOTE — Patient Instructions (Signed)
CALL ME WITH ANY PROBLEMS OR QUESTIONS (#[phone]).    HAPPY HOLIDAYS!!!!!   

## 2013-11-08 ENCOUNTER — Encounter: Payer: Self-pay | Admitting: Physical Medicine & Rehabilitation

## 2013-11-08 ENCOUNTER — Encounter
Payer: BC Managed Care – PPO | Attending: Physical Medicine & Rehabilitation | Admitting: Physical Medicine & Rehabilitation

## 2013-11-08 VITALS — BP 135/80 | HR 84 | Resp 14 | Ht 74.0 in | Wt 185.0 lb

## 2013-11-08 DIAGNOSIS — G9511 Acute infarction of spinal cord (embolic) (nonembolic): Secondary | ICD-10-CM

## 2013-11-08 DIAGNOSIS — IMO0002 Reserved for concepts with insufficient information to code with codable children: Secondary | ICD-10-CM

## 2013-11-08 DIAGNOSIS — I69959 Hemiplegia and hemiparesis following unspecified cerebrovascular disease affecting unspecified side: Secondary | ICD-10-CM | POA: Insufficient documentation

## 2013-11-08 DIAGNOSIS — G8389 Other specified paralytic syndromes: Secondary | ICD-10-CM

## 2013-11-08 DIAGNOSIS — G9519 Other vascular myelopathies: Secondary | ICD-10-CM

## 2013-11-08 NOTE — Patient Instructions (Signed)
CONTINUE TO WORK ON YOUR STRETCHING EVERY DAY

## 2013-11-08 NOTE — Progress Notes (Signed)
Botox Injection for spasticity using needle EMG guidance Indication: spastic paraparesis  Dilution: 100 Units/ml        Total Units Injected: 400, Left lower extremity Indication: Severe spasticity which interferes with ADL,mobility and/or  hygiene and is unresponsive to medication management and other conservative care Informed consent was obtained after describing risks and benefits of the procedure with the patient. This includes bleeding, bruising, infection, excessive weakness, or medication side effects. A REMS form is on file and signed.  Needle: 50mm injectable monopolar needle electrode  Number of units per muscle Gastroc/soleus 200 units, 4 access points Tibialis Anterior 0 units Tibialis Posterior 200 units, 4 access points   All injections were done after obtaining appropriate EMG activity and after negative drawback for blood. The patient tolerated the procedure well. Post procedure instructions were given. A followup appointment was made for 4 months.

## 2014-03-20 ENCOUNTER — Encounter: Payer: BC Managed Care – PPO | Admitting: Physical Medicine & Rehabilitation

## 2014-04-16 ENCOUNTER — Encounter: Payer: Self-pay | Attending: Physical Medicine & Rehabilitation | Admitting: Physical Medicine & Rehabilitation

## 2014-04-18 ENCOUNTER — Telehealth: Payer: Self-pay | Admitting: *Deleted

## 2014-04-18 NOTE — Telephone Encounter (Signed)
Donald Harvey's mother called and said that he missed his appt Monday and she forgot to call.  He is still out of town and she requests a call back.  I spoke with her and [name] will be out of town through July but she wants to get him back in before he starts school.  She was not clear if he was supposed to get another botox injection.  The appt 04/16/14 was just for a 4 month follow up.  She will call back and schedule when he gets back into town.  Please advise if this was to be a botox appt.

## 2014-04-18 NOTE — Telephone Encounter (Signed)
We can go ahead and schedule the same botox amount/site if he felt the last injections were helpful and if they have since "worn off"

## 2014-11-20 ENCOUNTER — Telehealth: Payer: Self-pay | Admitting: Internal Medicine

## 2014-11-20 NOTE — Telephone Encounter (Signed)
Pt is aware.  

## 2014-11-20 NOTE — Telephone Encounter (Signed)
Pt would like a rx for new wheel chair  [facility] attn megan [phone] phone #[phone].

## 2014-11-20 NOTE — Telephone Encounter (Signed)
Nelva Bushorma, please call pt has not been seen since 03/2013, maybe Dr. Hermelinda MedicusSchwartz can order wheelchair for pt, if not pt will need an appointment.

## 2015-12-18 ENCOUNTER — Other Ambulatory Visit: Payer: Self-pay

## 2015-12-20 ENCOUNTER — Other Ambulatory Visit: Payer: Self-pay

## 2015-12-27 ENCOUNTER — Encounter: Payer: Self-pay | Admitting: Internal Medicine

## 2016-01-03 ENCOUNTER — Other Ambulatory Visit (INDEPENDENT_AMBULATORY_CARE_PROVIDER_SITE_OTHER): Payer: BLUE CROSS/BLUE SHIELD

## 2016-01-03 DIAGNOSIS — Z0000 Encounter for general adult medical examination without abnormal findings: Secondary | ICD-10-CM

## 2016-01-03 LAB — HEPATIC FUNCTION PANEL
ALBUMIN: 4.5 g/dL (ref 3.5–5.2)
ALK PHOS: 59 U/L (ref 39–117)
ALT: 21 U/L (ref 0–53)
AST: 21 U/L (ref 0–37)
BILIRUBIN TOTAL: 0.4 mg/dL (ref 0.2–1.2)
Bilirubin, Direct: 0.1 mg/dL (ref 0.0–0.3)
Total Protein: 7 g/dL (ref 6.0–8.3)

## 2016-01-03 LAB — LIPID PANEL
CHOLESTEROL: 178 mg/dL (ref 0–200)
HDL: 41.5 mg/dL (ref >39.00)
LDL CALC: 125 mg/dL — AB (ref 0–99)
NonHDL: 136.29
Total CHOL/HDL Ratio: 4
Triglycerides: 54 mg/dL (ref 0.0–149.0)
VLDL: 10.8 mg/dL (ref 0.0–40.0)

## 2016-01-03 LAB — POC URINALSYSI DIPSTICK (AUTOMATED)
BILIRUBIN UA: NEGATIVE
GLUCOSE UA: NEGATIVE
KETONES UA: NEGATIVE
NITRITE UA: NEGATIVE
PH UA: 6
Spec Grav, UA: 1.025
Urobilinogen, UA: 2

## 2016-01-03 LAB — CBC WITH DIFFERENTIAL/PLATELET
BASOS ABS: 0 10*3/uL (ref 0.0–0.1)
BASOS PCT: 0.5 % (ref 0.0–3.0)
Eosinophils Absolute: 0 10*3/uL (ref 0.0–0.7)
Eosinophils Relative: 0.9 % (ref 0.0–5.0)
HCT: 45.7 % (ref 39.0–52.0)
Hemoglobin: 15.6 g/dL (ref 13.0–17.0)
LYMPHS PCT: 21.9 % (ref 12.0–46.0)
Lymphs Abs: 1.1 10*3/uL (ref 0.7–4.0)
MCHC: 34.2 g/dL (ref 30.0–36.0)
MCV: 91.9 fl (ref 78.0–100.0)
MONO ABS: 0.3 10*3/uL (ref 0.1–1.0)
Monocytes Relative: 6 % (ref 3.0–12.0)
NEUTROS PCT: 70.7 % (ref 43.0–77.0)
Neutro Abs: 3.5 10*3/uL (ref 1.4–7.7)
PLATELETS: 248 10*3/uL (ref 150.0–400.0)
RBC: 4.98 Mil/uL (ref 4.22–5.81)
RDW: 13.1 % (ref 11.5–15.5)
WBC: 5 10*3/uL (ref 4.0–10.5)

## 2016-01-03 LAB — BASIC METABOLIC PANEL
BUN: 10 mg/dL (ref 6–23)
CHLORIDE: 106 meq/L (ref 96–112)
CO2: 30 mEq/L (ref 19–32)
CREATININE: 1.11 mg/dL (ref 0.40–1.50)
Calcium: 9.4 mg/dL (ref 8.4–10.5)
GFR: 103.91 mL/min (ref >60.00)
Glucose, Bld: 104 mg/dL — ABNORMAL HIGH (ref 70–99)
POTASSIUM: 4.3 meq/L (ref 3.5–5.1)
Sodium: 143 mEq/L (ref 135–145)

## 2016-01-03 LAB — TSH: TSH: 1.82 u[IU]/mL (ref 0.35–4.50)

## 2016-01-06 ENCOUNTER — Telehealth: Payer: Self-pay | Admitting: Internal Medicine

## 2016-01-06 NOTE — Telephone Encounter (Signed)
That is fine if there is an opening.

## 2016-01-06 NOTE — Telephone Encounter (Signed)
Pt had to go out of town, and needs to reschedule his CPE.  Mom states he will not be back until 3/19 and wants to know if he can come in 22 or 24? Mom states he needs to get in to be referred for counciling.

## 2016-01-07 NOTE — Telephone Encounter (Signed)
Pt has been scheduled.  °

## 2016-01-10 ENCOUNTER — Encounter: Payer: Self-pay | Admitting: Internal Medicine

## 2016-01-22 ENCOUNTER — Encounter: Payer: Self-pay | Admitting: Internal Medicine

## 2016-01-22 ENCOUNTER — Ambulatory Visit (INDEPENDENT_AMBULATORY_CARE_PROVIDER_SITE_OTHER): Payer: BLUE CROSS/BLUE SHIELD | Admitting: Internal Medicine

## 2016-01-22 VITALS — BP 122/78 | HR 86 | Temp 98.2°F | Resp 20 | Ht 74.0 in | Wt 219.0 lb

## 2016-01-22 DIAGNOSIS — Z0001 Encounter for general adult medical examination with abnormal findings: Secondary | ICD-10-CM

## 2016-01-22 DIAGNOSIS — D6859 Other primary thrombophilia: Secondary | ICD-10-CM

## 2016-01-22 DIAGNOSIS — G8389 Other specified paralytic syndromes: Secondary | ICD-10-CM

## 2016-01-22 DIAGNOSIS — G9511 Acute infarction of spinal cord (embolic) (nonembolic): Secondary | ICD-10-CM | POA: Diagnosis not present

## 2016-01-22 DIAGNOSIS — IMO0002 Reserved for concepts with insufficient information to code with codable children: Secondary | ICD-10-CM

## 2016-01-22 NOTE — Progress Notes (Signed)
Subjective:    Patient ID: Donald Harvey, male    DOB: [birth year], 25 y.o.   MRN: [id number]  HPI   HPI  25  year-old patient who is seen today for a preventive health exam. He established with our practice approximately 5 years ago but has been seen infrequently.  The patient was in his usual state of excellent health until July 10, 2011  when he noticed weakness and numbness involving his lower extremities. 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 At that time he underwent an MRI of the spinal cord that was suggestive of transverse myelitis. He was ultimately transferred to [facility] where he received high-dose steroids for 4 days. He continued to deteriorate with further loss of strength. Evaluation also included an extensive rheumatological panel that was normal; he underwent plasmapheresis and a followup MRI showed progression of his spinal cord lesion that extended from T1-T11; at that time a spinal cord vasculopathy was raised and a spinal arteriogram did not show any AV malformations. A CTA of the of the brain however did confirm a right-sided nonocclusive thrombus in the transverse and sphenoid sinus areas and a primary coagulopathy was then considered; an extensive hypercoagulable workup was largely nonrevealing except for a low plasminogen level elevated thrombin clot time and low fibrinogen level; a hematology workup was negative for malignancy; this included a testicular ultrasound and a full-body PET scan. The patient was placed on Coumadin anticoagulation on September 15; evaluation also revealed a transthoracic echo that showed no evidence of thrombi; he was later transferred to the [facility].   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  He is considering a graduate program but also is looking for employment.  He has  been able 3 with a walker and this has probably improved over the past 2 years.  He occasionally uses a treadmill for additional ambulation but has become more sedentary since his recent graduation.  His chief complaint continues to be left lateral foot pain related to his equinovarus deformity.  This has been treated in the past with Botox must relaxants.  He also has a left ankle/foot brace that he does not feel has been very helpful.  He has been seen by rehabilitation locally. He still requires intermittent out self-catheterization is due to his neurogenic bladder  He has been given a diagnosis of hypercoagulable state based on his spinal cord infarction and family history of thrombotic disease.  A brother died postoperatively in his thirties of a pulmonary embolism.  He has had an aunt with a DVT.  A grandmother also died of complications of acute pulmonary embolism.  He has been treated with Coumadin anticoagulation, which he has only taken sporadically and not recently  Past medical history is otherwise unremarkable   Social history. 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 Presently lives in a first floor apartment with his mother  and is home for the summer but plans to return to [employer] in the fall for his final year  Family history:  multiple family members on his father's side have had clotting issues and multiple family members on blood thinners.   Functional history. Over the past 2 years his paraplegia has improved. He has had  a T6 sensory level  that has  improved. He is able to transfer from a wheelchair to bed without difficulty. He now is able to ambulate with a walker.  He does have left foot /ankle braces . He does require in and out self-catheterization 4 times daily. He has a neurogenic bowel and requires nightly suppositories and occasional digital disimpaction.       Review of  Systems  Constitutional: Negative for fever, chills, appetite change and fatigue.  HENT: Negative for congestion, dental problem, ear pain, hearing loss, sore throat, tinnitus, trouble swallowing and voice change.   Eyes: Negative for pain, discharge and visual disturbance.  Respiratory: Negative for cough, chest tightness, wheezing and stridor.   Cardiovascular: Negative for chest pain, palpitations and leg swelling.  Gastrointestinal: Positive for constipation. Negative for nausea, vomiting, abdominal pain, diarrhea, blood in stool and abdominal distention.  Genitourinary: Positive for difficulty urinating. Negative for urgency, hematuria, flank pain, discharge and genital sores.  Musculoskeletal: Positive for gait problem. Negative for myalgias, back pain, joint swelling, arthralgias and neck stiffness.  Skin: Negative for rash.  Neurological: Positive for weakness. Negative for dizziness, syncope, speech difficulty, numbness and headaches.  Hematological: Negative for adenopathy. Does not bruise/bleed easily.  Psychiatric/Behavioral: Negative for behavioral problems and dysphoric mood. The patient is not nervous/anxious.        Objective:   Physical Exam  Constitutional: He is oriented to person, place, and time. He appears well-developed and well-nourished.  HENT:  Head: Normocephalic and atraumatic.  Right Ear: External ear normal.  Left Ear: External ear normal.  Nose: Nose normal.  Mouth/Throat: Oropharynx is clear and moist.  Eyes: Conjunctivae and EOM are normal. Pupils are equal, round, and reactive to light. No scleral icterus.  Neck: Normal range of motion. Neck supple. No JVD present. No thyromegaly present.  Cardiovascular: Regular rhythm, normal heart sounds and intact distal pulses.  Exam reveals no gallop and no friction rub.   No murmur heard. Pulmonary/Chest: Effort normal and breath sounds normal. He exhibits no tenderness.  Abdominal: Soft. Bowel sounds are normal.  He exhibits no distension and no mass. There is no tenderness.  Genitourinary: Prostate normal and penis normal.  Musculoskeletal: Normal range of motion. He exhibits no edema or tenderness.  Lymphadenopathy:    He has no cervical adenopathy.  Neurological: He is alert and oriented to person, place, and time. He displays abnormal reflex. No cranial nerve deficit. He exhibits abnormal muscle tone. Coordination abnormal.  Spastic lower extremity weakness the left leg greater than the right; varus deformity left foot with inability to dorsiflex or plantar flex foot; Spastic paraparesis, left leg affected more than the right Decreased sensation right leg  Hyperreflexia lower extremities    Skin: Skin is warm and dry. No rash noted.  Psychiatric: He has a normal mood and affect. His behavior is normal. Judgment and thought content normal.          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  Patient has not been on chronic anticoagulation.  Options discussed.  Have elected to refer to hematology for their input on chronic anticoagulation and perhaps with a novel agent that does not require monitoring  A rehabilitation consult will be requested.  Perhaps additional Botox treatment and perhaps a new left ankle/foot brace may be helpful with his equinovarus deformity which does limit his activity

## 2016-01-22 NOTE — Patient Instructions (Signed)
Rehab consult as discussed  Hematology consult as discussed  Health Maintenance, Male A healthy lifestyle and preventative care can promote health and wellness.  Maintain regular health, dental, and eye exams.  Eat a healthy diet. Foods like vegetables, fruits, whole grains, low-fat dairy products, and lean protein foods contain the nutrients you need and are low in calories. Decrease your intake of foods high in solid fats, added sugars, and salt. Get information about a proper diet from your health care provider, if necessary.  Regular physical exercise is one of the most important things you can do for your health. Most adults should get at least 150 minutes of moderate-intensity exercise (any activity that increases your heart rate and causes you to sweat) each week. In addition, most adults need muscle-strengthening exercises on 2 or more days a week.   Maintain a healthy weight. The body mass index (BMI) is a screening tool to identify possible weight problems. It provides an estimate of body fat based on height and weight. Your health care provider can find your BMI and can help you achieve or maintain a healthy weight. For males 20 years and older:  A BMI below 18.5 is considered underweight.  A BMI of 18.5 to 24.9 is normal.  A BMI of 25 to 29.9 is considered overweight.  A BMI of 30 and above is considered obese.  Maintain normal blood lipids and cholesterol by exercising and minimizing your intake of saturated fat. Eat a balanced diet with plenty of fruits and vegetables. Blood tests for lipids and cholesterol should begin at age 25 and be repeated every 5 years. If your lipid or cholesterol levels are high, you are over age 25, or you are at high risk for heart disease, you may need your cholesterol levels checked more frequently.Ongoing high lipid and cholesterol levels should be treated with medicines if diet and exercise are not working.  If you smoke, find out from your  health care provider how to quit. If you do not use tobacco, do not start.  Lung cancer screening is recommended for adults aged 55-80 years who are at high risk for developing lung cancer because of a history of smoking. A yearly low-dose CT scan of the lungs is recommended for people who have at least a 30-pack-year history of smoking and are current smokers or have quit within the past 15 years. A pack year of smoking is smoking an average of 1 pack of cigarettes a day for 1 year (for example, a 30-pack-year history of smoking could mean smoking 1 pack a day for 30 years or 2 packs a day for 15 years). Yearly screening should continue until the smoker has stopped smoking for at least 15 years. Yearly screening should be stopped for people who develop a health problem that would prevent them from having lung cancer treatment.  If you choose to drink alcohol, do not have more than 2 drinks per day. One drink is considered to be 12 oz (360 mL) of beer, 5 oz (150 mL) of wine, or 1.5 oz (45 mL) of liquor.  Avoid the use of street drugs. Do not share needles with anyone. Ask for help if you need support or instructions about stopping the use of drugs.  High blood pressure causes heart disease and increases the risk of stroke. High blood pressure is more likely to develop in:  People who have blood pressure in the end of the normal range (100-139/85-89 mm Hg).  People who are  overweight or obese.  People who are African American.  If you are 31-35 years of age, have your blood pressure checked every 3-5 years. If you are 2 years of age or older, have your blood pressure checked every year. You should have your blood pressure measured twice--once when you are at a hospital or clinic, and once when you are not at a hospital or clinic. Record the average of the two measurements. To check your blood pressure when you are not at a hospital or clinic, you can use:  An automated blood pressure machine at a  pharmacy.  A home blood pressure monitor.  If you are 57-70 years old, ask your health care provider if you should take aspirin to prevent heart disease.  Diabetes screening involves taking a blood sample to check your fasting blood sugar level. This should be done once every 3 years after age 29 if you are at a normal weight and without risk factors for diabetes. Testing should be considered at a younger age or be carried out more frequently if you are overweight and have at least 1 risk factor for diabetes.  Colorectal cancer can be detected and often prevented. Most routine colorectal cancer screening begins at the age of 25 and continues through age 3. However, your health care provider may recommend screening at an earlier age if you have risk factors for colon cancer. On a yearly basis, your health care provider may provide home test kits to check for hidden blood in the stool. A small camera at the end of a tube may be used to directly examine the colon (sigmoidoscopy or colonoscopy) to detect the earliest forms of colorectal cancer. Talk to your health care provider about this at age 46 when routine screening begins. A direct exam of the colon should be repeated every 5-10 years through age 51, unless early forms of precancerous polyps or small growths are found.  People who are at an increased risk for hepatitis B should be screened for this virus. You are considered at high risk for hepatitis B if:  You were born in a country where hepatitis B occurs often. Talk with your health care provider about which countries are considered high risk.  Your parents were born in a high-risk country and you have not received a shot to protect against hepatitis B (hepatitis B vaccine).  You have HIV or AIDS.  You use needles to inject street drugs.  You live with, or have sex with, someone who has hepatitis B.  You are a man who has sex with other men (MSM).  You get hemodialysis  treatment.  You take certain medicines for conditions like cancer, organ transplantation, and autoimmune conditions.  Hepatitis C blood testing is recommended for all people born from [birth year] through 1965 and any individual with known risk factors for hepatitis C.  Healthy men should no longer receive prostate-specific antigen (PSA) blood tests as part of routine cancer screening. Talk to your health care provider about prostate cancer screening.  Testicular cancer screening is not recommended for adolescents or adult males who have no symptoms. Screening includes self-exam, a health care provider exam, and other screening tests. Consult with your health care provider about any symptoms you have or any concerns you have about testicular cancer.  Practice safe sex. Use condoms and avoid high-risk sexual practices to reduce the spread of sexually transmitted infections (STIs).  You should be screened for STIs, including gonorrhea and chlamydia if:  You are  sexually active and are younger than 24 years.  You are older than 24 years, and your health care provider tells you that you are at risk for this type of infection.  Your sexual activity has changed since you were last screened, and you are at an increased risk for chlamydia or gonorrhea. Ask your health care provider if you are at risk.  If you are at risk of being infected with HIV, it is recommended that you take a prescription medicine daily to prevent HIV infection. This is called pre-exposure prophylaxis (PrEP). You are considered at risk if:  You are a man who has sex with other men (MSM).  You are a heterosexual man who is sexually active with multiple partners.  You take drugs by injection.  You are sexually active with a partner who has HIV.  Talk with your health care provider about whether you are at high risk of being infected with HIV. If you choose to begin PrEP, you should first be tested for HIV. You should then be tested  every 3 months for as long as you are taking PrEP.  Use sunscreen. Apply sunscreen liberally and repeatedly throughout the day. You should seek shade when your shadow is shorter than you. Protect yourself by wearing long sleeves, pants, a wide-brimmed hat, and sunglasses year round whenever you are outdoors.  Tell your health care provider of new moles or changes in moles, especially if there is a change in shape or color. Also, tell your health care provider if a mole is larger than the size of a pencil eraser.  A one-time screening for abdominal aortic aneurysm (AAA) and surgical repair of large AAAs by ultrasound is recommended for men aged 65-75 years who are current or former smokers.  Stay current with your vaccines (immunizations).   This information is not intended to replace advice given to you by your health care provider. Make sure you discuss any questions you have with your health care provider.   Document Released: 04/16/2008 Document Revised: 11/09/2014 Document Reviewed: 03/16/2011 Elsevier Interactive Patient Education [employer]

## 2016-01-22 NOTE — Progress Notes (Signed)
Pre visit review using our clinic review tool, if applicable. No additional management support is needed unless otherwise documented below in the visit note. 

## 2016-01-30 ENCOUNTER — Encounter: Payer: Self-pay | Admitting: Oncology

## 2016-01-30 ENCOUNTER — Telehealth: Payer: Self-pay | Admitting: Hematology

## 2016-01-30 NOTE — Telephone Encounter (Signed)
Lt mess regarding new pt referral for hem appt.

## 2016-02-10 ENCOUNTER — Encounter: Payer: Self-pay | Admitting: Physical Medicine & Rehabilitation

## 2016-02-10 ENCOUNTER — Encounter
Payer: BLUE CROSS/BLUE SHIELD | Attending: Physical Medicine & Rehabilitation | Admitting: Physical Medicine & Rehabilitation

## 2016-02-10 VITALS — BP 125/77 | HR 76

## 2016-02-10 DIAGNOSIS — G9511 Acute infarction of spinal cord (embolic) (nonembolic): Secondary | ICD-10-CM | POA: Diagnosis present

## 2016-02-10 DIAGNOSIS — I749 Embolism and thrombosis of unspecified artery: Secondary | ICD-10-CM | POA: Insufficient documentation

## 2016-02-10 DIAGNOSIS — G8220 Paraplegia, unspecified: Secondary | ICD-10-CM | POA: Insufficient documentation

## 2016-02-10 DIAGNOSIS — IMO0002 Reserved for concepts with insufficient information to code with codable children: Secondary | ICD-10-CM

## 2016-02-10 DIAGNOSIS — G8110 Spastic hemiplegia affecting unspecified side: Secondary | ICD-10-CM | POA: Insufficient documentation

## 2016-02-10 DIAGNOSIS — G8389 Other specified paralytic syndromes: Secondary | ICD-10-CM

## 2016-02-10 DIAGNOSIS — G8381 Brown-Sequard syndrome: Secondary | ICD-10-CM | POA: Insufficient documentation

## 2016-02-10 MED ORDER — CLONAZEPAM 0.5 MG PO TABS: 0.5000 mg | ORAL_TABLET | Freq: Two times a day (BID) | ORAL | Status: DC

## 2016-02-10 NOTE — Progress Notes (Signed)
Subjective:    Patient ID: Donald Harvey, male    DOB: 12-28-1990, 25 y.o.   MRN: [id number]  HPI   Donald Harvey is here in follow up of of his chronic spastic paraparesis. I last saw him over 2 years ago. He had good results with the botox injections with increase in symptoms gradually over 3 months or so. He thought that the injections would last longer.Marland Kitchen  He graduated from college in December. He's at home with parents now. 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 He is using his w/c as primary source of mobility. He will stand on occasion to stretch. He is really not walking around home. He last had any PT in 2013.    Pain Inventory Average Pain 0 Pain Right Now 0 My pain is no pain  In the last 24 hours, has pain interfered with the following? General activity 4 Relation with others 10 Enjoyment of life no pain What TIME of day is your pain at its worst? no pain Sleep (in general) Good  Pain is worse with: no pain Pain improves with: no pain Relief from Meds: no pain  Mobility use a walker ability to climb steps?  yes do you drive?  yes  Function disabled: date disabled .  Neuro/Psych No problems in this area  Prior Studies Any changes since last visit?  no  Physicians involved in your care Any changes since last visit?  no   History reviewed. No pertinent family history. Social History   Social History  . Marital Status: Single    Spouse Name: N/A  . Number of Children: N/A  . Years of Education: N/A   Social History Main Topics  . Smoking status: Never Smoker   . Smokeless tobacco: Never Used  . Alcohol Use: Yes  . Drug Use: No  . Sexual Activity: Not Asked   Other Topics Concern  . None   Social History Narrative   History reviewed. No pertinent past surgical history. Past Medical History  Diagnosis Date  . Childhood asthma   . Embolism - blood clot   . Spinal paraplegia (HCC)    BP 125/77 mmHg  Pulse 76  SpO2 98%  Opioid Risk Score:     Fall Risk Score:  `1  Depression screen PHQ 2/9  Depression screen [facility] 2/9 02/10/2016 01/22/2016  Decreased Interest 0 0  Down, Depressed, Hopeless 0 0  PHQ - 2 Score 0 0     Review of Systems  All other systems reviewed and are negative.      Objective:   Physical Exam  General: Alert and oriented x 3, No apparent distress  HEENT: Head is normocephalic, atraumatic, PERRLA, EOMI, sclera anicteric, oral mucosa pink and moist, dentition intact, ext ear canals clear,  Neck: Supple without JVD or lymphadenopathy  Heart: Reg rate and rhythm. No murmurs rubs or gallops  Chest: CTA bilaterally without wheezes, rales, or rhonchi; no distress  Abdomen: Soft, non-tender, non-distended, bowel sounds positive.  Extremities: No clubbing, cyanosis, or edema. Pulses are 2+  Skin: Clean and intact without signs of breakdown  Neuro: Pt is cognitively appropriate with normal insight, memory, and awareness. Cranial nerves 2-12 are intact. Diminished to LT and PP particularly in the right leg. He does have a lower abdominal sensory level more so ln the right. Left leg was less affected. Reflexes are 2+ in the [place]. He is 2++ in the RLE and 3++ LLE. He has sustained clonus at the left  ankle, none on the right. He has 3-4/4 tone along the gastroc, post-tib and tib anterior with equinovarus deformity noted once again--. UES is 5/5. RLE is 4 to 4+/5 with decreased FMC. LLE is 1-2 with HF, HE was 1. KE 1-2, ADF and APF essentially trace to 1.  Musculoskeletal: Full ROM, No pain with AROM or PROM in the neck, trunk, or extremities. Posture appropriate  Psych: Pt's affect is very flat.    Assessment & Plan:   1. 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 The distal left lower extremity is most profoundly affected from a spasticity and motor standpoint.    Plan:  1.Can consider botox in the future although I believe his  expectations were unrealistic. 2. Will make a referral to PT to address tone and functional mobility, lower ext strength. i asked him to bring in his brace to therapy. (KAFO?) 3. Trial of klonopin low dose for tone. Begin at night for one week then increase to twice daily thereafter. 4.  15 minutes of face to face patient care time were spent during this visit. All questions were encouraged and answered.

## 2016-02-10 NOTE — Patient Instructions (Signed)
PLEASE CALL ME WITH ANY PROBLEMS OR QUESTIONS (#[phone]).     PLEASE BRING YOUR OLD BRACE TO THERAPY

## 2016-02-12 ENCOUNTER — Ambulatory Visit: Payer: BLUE CROSS/BLUE SHIELD | Admitting: Oncology

## 2016-02-13 ENCOUNTER — Ambulatory Visit: Payer: BLUE CROSS/BLUE SHIELD | Admitting: Oncology

## 2016-02-18 ENCOUNTER — Telehealth: Payer: Self-pay | Admitting: Oncology

## 2016-02-18 NOTE — Telephone Encounter (Signed)
Pt mom called to r/s done....pk and aware of new d.t

## 2016-02-20 ENCOUNTER — Ambulatory Visit: Payer: BLUE CROSS/BLUE SHIELD | Admitting: Oncology

## 2016-02-24 ENCOUNTER — Telehealth: Payer: Self-pay | Admitting: Oncology

## 2016-02-24 NOTE — Telephone Encounter (Signed)
Pt's mom called to reschedule appt due to a conflict with another appointment

## 2016-03-03 ENCOUNTER — Ambulatory Visit: Payer: BLUE CROSS/BLUE SHIELD | Admitting: Physical Therapy

## 2016-03-03 ENCOUNTER — Ambulatory Visit: Payer: BLUE CROSS/BLUE SHIELD | Attending: Internal Medicine

## 2016-03-03 DIAGNOSIS — G8112 Spastic hemiplegia affecting left dominant side: Secondary | ICD-10-CM | POA: Diagnosis present

## 2016-03-03 DIAGNOSIS — G8113 Spastic hemiplegia affecting right nondominant side: Secondary | ICD-10-CM | POA: Diagnosis present

## 2016-03-03 DIAGNOSIS — R2689 Other abnormalities of gait and mobility: Secondary | ICD-10-CM

## 2016-03-04 ENCOUNTER — Ambulatory Visit: Payer: BLUE CROSS/BLUE SHIELD | Admitting: Physical Therapy

## 2016-03-04 ENCOUNTER — Ambulatory Visit: Payer: BLUE CROSS/BLUE SHIELD | Admitting: Oncology

## 2016-03-04 NOTE — Therapy (Signed)
[facility] [address] [place], [place], [address] Phone: [phone]   Fax:  [phone]  Physical Therapy Evaluation  Patient Details  Name: Donald Harvey MRN: [id number] Date of Birth: 05-Apr-1991 Referring Provider: Dr. Riley Kill  Encounter Date: 03/03/2016      PT End of Session - 03/04/16 1143    Visit Number 1   Number of Visits 17   Date for PT Re-Evaluation 05/02/16   Authorization Type BC[employer]-emailed Misty Stanley regarding benefits   PT Start Time 1318   PT Stop Time 1355   PT Time Calculation (min) 37 min   Equipment Utilized During Treatment Gait belt   Activity Tolerance Patient tolerated treatment well   Behavior During Therapy [facility] for tasks assessed/performed      Past Medical History  Diagnosis Date  . Childhood asthma   . Embolism - blood clot   . Spinal paraplegia [facility])     History reviewed. No pertinent past surgical history.  There were no vitals filed for this visit.       Subjective Assessment - 03/03/16 1322    Subjective Pt graduated from college at [employer] (10/2015) and was receiving OPPT at that time, he progressed to walking with one crutch but has not had PT since that time. Pt reports his past PT was discussing potentially progressing from L KAFO to L AFO. His current  KAFO causes a pressure/sore spot on L fifth toe during amb.  Pt reported he has intermiitent LBP on L side, it mainly occurs during amb. (especially when L KAFO causes R toe pain).  Pt reports intermittent BLE spasticity (spasticity worse in L ankle). Pt stated he's thinking of playing w/c basketball but has not made up his mind.   Pertinent History Hx of T6 SCI infarction   Patient Stated Goals To walk more   Currently in Pain? No/denies            [facility] PT Assessment - 03/03/16 1327    Assessment   Medical Diagnosis Spastic paraplegia   Referring Provider Dr. Riley Kill   Onset Date/Surgical Date 07/10/11   Hand Dominance Left   Prior Therapy [place]PPT in [place] and at [facility] neuro   Precautions   Precautions Fall   Precaution Comments --   Required Braces or Orthoses --  L KAFO for amb.   Restrictions   Weight Bearing Restrictions No   Balance Screen   Has the patient fallen in the past 6 months Yes   How many times? 2   Has the patient had a decrease in activity level because of a fear of falling?  No   Is the patient reluctant to leave their home because of a fear of falling?  No   Home Nurse, mental health Private residence   Living Arrangements Parent   Available Help at Discharge Family   Type of Home Apartment   Home Access Stairs to enter   Entrance Stairs-Number of Steps 12   Entrance Stairs-Rails Can reach both   Home Layout One level   Home Equipment Walker - 2 wheels  L KAFO   Prior Function   Level of Independence Independent   Vocation Other (comment)  recent college grad seeking employment   Leisure [place], going out with friends   Cognition   Overall Cognitive Status Within Functional Limits for tasks assessed   Sensation   Light Touch Impaired by gross assessment   Additional Comments Decr. pain and temp  and pin prick in RLE and decr. light touch in LLE.   Posture/Postural Control   Posture/Postural Control Postural limitations   Postural Limitations Flexed trunk;Rounded Shoulders   Tone   Assessment Location --  Not formally assessed, but pt reported BLE spasticity.   ROM / Strength   AROM / PROM / Strength AROM;Strength   AROM   Overall AROM  Within functional limits for tasks performed   Strength   Overall Strength Deficits   Overall Strength Comments RLE WFL. LLE: hip flex: 2+/5, knee ext/flex: 3+/5, ankle PF: 2/5, ankle DF: 0/5.   Transfers   Transfers Sit to Stand;Stand to Sit   Sit to Stand 5: Supervision;With upper extremity assist;From chair/3-in-1   Stand to Sit 5: Supervision;With upper extremity assist;To chair/3-in-1    Ambulation/Gait   Ambulation/Gait Yes   Ambulation/Gait Assistance 5: Supervision   Ambulation/Gait Assistance Details No overt LOB   Ambulation Distance (Feet) 75 Feet   Assistive device Rolling walker  and L KAFO   Gait Pattern Step-through pattern;Decreased weight shift to left;Decreased dorsiflexion - left;Decreased stride length;Trunk flexed;Left hip hike   Ambulation Surface Level;Indoor   Gait velocity 1.4536ft/sec. with RW   Balance   Balance Assessed Yes   Static Standing Balance   Static Standing - Balance Support No upper extremity supported;Bilateral upper extremity supported   Static Standing - Level of Assistance 4: Min assist;Other (comment)  min guard   Static Standing Balance -  Activities  Single Leg Stance - Right Leg;Single Leg Stance - Left Leg   Static Standing - Comment/# of Minutes Pt required UE support during L SLS and min guard to min A during R SLS. Pt required min guard during feet together with eyes open/closed 2/2 incr. postural sway.   Standardized Balance Assessment   Standardized Balance Assessment Timed Up and Go Test   Timed Up and Go Test   TUG Normal TUG   Normal TUG (seconds) 24  with RW and KAFO                           PT Education - 03/04/16 1142    Education provided Yes   Education Details PT discussed outcome measure results and PT frequency/duration. PT discussed obtainig new w/c cushion and eval for w/c due to worn state.   Person(s) Educated Patient   Methods Explanation   Comprehension Verbalized understanding          PT Short Term Goals - 03/04/16 1150    PT SHORT TERM GOAL #1   Title Pt will be IND in HEP to improve balance and strength. Target date: 03/31/16   Status New   PT SHORT TERM GOAL #2   Title Pt will improve gait speed to >/=1.658ft/sec. with LRAD to decr. falls risk. Target date: 03/31/16   Status New   PT SHORT TERM GOAL #3   Title Perform BERG and write goals. Target date: 03/31/16   Status  New   PT SHORT TERM GOAL #4   Title Pt will amb. 300' over even terrain with LRAD and AFO to improve functional mobility. Target date: 03/31/16   Status New   PT SHORT TERM GOAL #5   Title Pt will traverse 12 steps with one handrail and AFO at MOD I level to negotiate steps at home. Target date: 03/31/16   Status New           PT Long Term Goals - 03/04/16 1152  PT LONG TERM GOAL #1   Title Pt will amb. 500' over paved and even surfaces with SPC and AFO at MOD I level in order to improve functional mobilty.Target date:04/28/16   Status New   PT LONG TERM GOAL #2   Title Pt will improve gait speed to >/=2.59ft/sec with LRAD and AFO to amb. safely in the community. Target date: 04/28/16   Status New   PT LONG TERM GOAL #3   Title Pt will amb. 14' with AFO and min guard and no AD to amb. safely at home. Target date: 04/28/16   Status New   PT LONG TERM GOAL #4   Title Pt will verbalize plans to join gym and/or play w/c basketball to maintain gains made in PT and improve quality of life. Target date: 04/28/16   Status New               Plan - 03/04/16 1144    Clinical Impression Statement Pt is a pleasant 25y/o male with hx of T6 SCI infarct in 2012. The pt's examination revealed the following impairments: LE weakness, gait deviations, impaired balance, decr. skin integrity of L foot due to L KAFO, decr. endurance, and impaire posture. PT also noted pt's w/c cushion and w/c are worn and not providing proper wt. distrubution and function. Pt's gait speed and TUG time indciate pt is at risk for falls. Healing grade I pressure sore on plantar surface of L 5th metatarsal head and healing grade II pressure sore at lateral base of 5th metatarsal.  PT will formally assess balance next session.    Rehab Potential Good   Clinical Impairments Affecting Rehab Potential Hx of SCI infarct   PT Frequency 2x / week   PT Duration 8 weeks   PT Treatment/Interventions ADLs/Self Care Home  Management;Biofeedback;Balance training;Manual techniques;Therapeutic exercise;Therapeutic activities;Orthotic Fit/Training;Functional mobility training;Stair training;Gait training;Wheelchair mobility training;Patient/family education;DME Instruction;Neuromuscular re-education   PT Next Visit Plan Assess BERG, provide balance/strength HEP   Consulted and Agree with Plan of Care Patient      Patient will benefit from skilled therapeutic intervention in order to improve the following deficits and impairments:  Abnormal gait, Decreased endurance, Impaired flexibility, Decreased coordination, Decreased balance, Decreased mobility, Decreased skin integrity, Decreased knowledge of use of DME, Decreased strength, Impaired tone  Visit Diagnosis: Spastic hemiplegia affecting left dominant side (HCC) - Plan: PT plan of care cert/re-cert  Spastic hemiplegia affecting right nondominant side (HCC) - Plan: PT plan of care cert/re-cert  Other abnormalities of gait and mobility - Plan: PT plan of care cert/re-cert     Problem List Patient Active Problem List   Diagnosis Date Noted  . 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 Food allergy 06/19/2012  . 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 Hypercoagulable state (HCC) 09/22/2011  . Neurogenic bladder 09/22/2011  . Neurogenic bowel 09/22/2011    Miller,Jennifer L 03/04/2016, 11:58 AM  [facility] [address] [place], [place], [address] Phone: [phone]   Fax:  [phone]  Name: Donald Harvey MRN: [id number] Date of Birth: 11-08-1990

## 2016-03-11 ENCOUNTER — Encounter: Payer: Self-pay | Admitting: Physical Medicine & Rehabilitation

## 2016-03-11 ENCOUNTER — Encounter
Payer: BLUE CROSS/BLUE SHIELD | Attending: Physical Medicine & Rehabilitation | Admitting: Physical Medicine & Rehabilitation

## 2016-03-11 VITALS — BP 143/72 | HR 79 | Resp 16

## 2016-03-11 DIAGNOSIS — IMO0002 Reserved for concepts with insufficient information to code with codable children: Secondary | ICD-10-CM

## 2016-03-11 DIAGNOSIS — G8389 Other specified paralytic syndromes: Secondary | ICD-10-CM | POA: Diagnosis not present

## 2016-03-11 DIAGNOSIS — I749 Embolism and thrombosis of unspecified artery: Secondary | ICD-10-CM | POA: Insufficient documentation

## 2016-03-11 DIAGNOSIS — G8381 Brown-Sequard syndrome: Secondary | ICD-10-CM | POA: Insufficient documentation

## 2016-03-11 DIAGNOSIS — G8110 Spastic hemiplegia affecting unspecified side: Secondary | ICD-10-CM | POA: Diagnosis not present

## 2016-03-11 DIAGNOSIS — G8220 Paraplegia, unspecified: Secondary | ICD-10-CM | POA: Insufficient documentation

## 2016-03-11 DIAGNOSIS — G9511 Acute infarction of spinal cord (embolic) (nonembolic): Secondary | ICD-10-CM | POA: Diagnosis present

## 2016-03-11 MED ORDER — CLONAZEPAM 1 MG PO TABS: 1.0000 mg | ORAL_TABLET | Freq: Two times a day (BID) | ORAL | Status: DC

## 2016-03-11 NOTE — Progress Notes (Signed)
Subjective:    Patient ID: Donald MorrowJeremy E Impson, male    DOB: [birth year], 25 y.o.   MRN: [id number]  HPI   Donald RuskJeremy is here in follow up of his brown sequard syndrome and spastic paraparesis. He has had positive effects with the klonopin. His spasticity and clonus is improved. He was a little sleepy at first but is now doing well with the medicine. He had his PT eval last week. They will look at moving him to an AFO (from the KAFO) in addition to working on gait and spasiticty/contracture mgt.   He is in a w/c today which is pretty worn down.     Pain Inventory Average Pain 0 Pain Right Now 0 My pain is NA  In the last 24 hours, has pain interfered with the following? General activity 0 Relation with others 0 Enjoyment of life 0 What TIME of day is your pain at its worst? NA Sleep (in general) Good  Pain is worse with: NA Pain improves with: NA Relief from Meds: NA  Mobility walk with assistance use a walker use a wheelchair  Function not employed: date last employed NA Do you have any goals in this area?  yes  Neuro/Psych bladder control problems bowel control problems  Prior Studies Any changes since last visit?  no NA  Physicians involved in your care Any changes since last visit?  no   History reviewed. No pertinent family history. Social History   Social History  . Marital Status: Single    Spouse Name: N/A  . Number of Children: N/A  . Years of Education: N/A   Social History Main Topics  . Smoking status: Never Smoker   . Smokeless tobacco: Never Used  . Alcohol Use: Yes  . Drug Use: No  . Sexual Activity: Not Asked   Other Topics Concern  . None   Social History Narrative   History reviewed. No pertinent past surgical history. Past Medical History  Diagnosis Date  . Childhood asthma   . Embolism - blood clot   . Spinal paraplegia (HCC)    BP 143/72 mmHg  Pulse 79  Resp 16  SpO2 99%  Opioid Risk Score:   Fall Risk Score:   `1  Depression screen PHQ 2/9  [facility] 02/10/2016 01/22/2016  Decreased Interest 0 0  Down, Depressed, Hopeless 0 0  PHQ - 2 Score 0 0      Review of Systems  All other systems reviewed and are negative.      Objective:   Physical Exam  General: Alert and oriented x 3, No apparent distress  HEENT: Head is normocephalic, atraumatic, PERRLA, EOMI, sclera anicteric, oral mucosa pink and moist, dentition intact, ext ear canals clear,  Neck: Supple without JVD or lymphadenopathy  Heart: Reg rate and rhythm. No murmurs rubs or gallops  Chest: CTA bilaterally without wheezes, rales, or rhonchi; no distress  Abdomen: Soft, non-tender, non-distended, bowel sounds positive.  Extremities: No clubbing, cyanosis, or edema. Pulses are 2+  Skin: Clean and intact without signs of breakdown  Neuro: Pt is cognitively appropriate with normal insight, memory, and awareness. Cranial nerves 2-12 are intact. Diminished to LT and PP particularly in the right leg. He does have a lower abdominal sensory level more so ln the right. Left leg was less affected. Reflexes are 2+ in the Uppers. He is 2++ in the RLE and 3++ LLE. He has minimal clonus in the left ankle, none on the right. He  has 3 /4 tone along the gastroc, post-tib and tib anterior with equinovarus deformity noted once again--slightly decreased. Heel cord remains very tight--. UES is 5/5. RLE is 4 to 4+/5 with decreased FMC. LLE is 1-2 with HF, HE was 1. KE 1-2, ADF and APF essentially trace to 1.  Musculoskeletal: Full ROM, No pain with AROM or PROM in the neck, trunk, or extremities. Posture appropriate  Psych: Pt's affect remains very flat. OTHER: w/c is worn down. Tires are flat, brakes are dysfunctional. Seating is tearing, rims are worn.    Assessment & Plan: 1. 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 The distal left lower extremity is most profoundly affected  from a spasticity and motor standpoint.   Plan:  1.Can consider botox in the future although this heel cord needs stretching. Discussed the importance of daily stretching of the LLE 2. Continue with outpt PT to address tone and functional mobility, lower ext strength. He needs a new AFO (or revision of KAFO) as well as a new w/c.  3. Increase klonopin to  BID.     4. 15 minutes of face to face patient care time were spent during this visit. All questions were encouraged and answered.

## 2016-03-11 NOTE — Patient Instructions (Signed)
CONTINUE TO WORK ON STRETCHING YOUR LEFT HEEL CORD. THE MORE THE BETTER!

## 2016-03-12 ENCOUNTER — Ambulatory Visit: Payer: BLUE CROSS/BLUE SHIELD | Admitting: Physical Therapy

## 2016-03-12 DIAGNOSIS — G8112 Spastic hemiplegia affecting left dominant side: Secondary | ICD-10-CM | POA: Diagnosis not present

## 2016-03-12 DIAGNOSIS — G8113 Spastic hemiplegia affecting right nondominant side: Secondary | ICD-10-CM

## 2016-03-12 DIAGNOSIS — R2689 Other abnormalities of gait and mobility: Secondary | ICD-10-CM

## 2016-03-12 NOTE — Therapy (Signed)
[facility] [address] [place], [place], [address] Phone: [phone]   Fax:  [phone]  Physical Therapy Treatment  Patient Details  Name: Donald Harvey MRN: [id number] Date of Birth: Oct 23, 1991 Referring Provider: Dr. Riley KillSwartz  Encounter Date: 03/12/2016      PT End of Session - 03/12/16 1552    Visit Number 2   Number of Visits 17   Date for PT Re-Evaluation 05/02/16   Authorization Type BCBS-emailed Misty StanleyLisa regarding benefits   PT Start Time 1502   PT Stop Time 1545   PT Time Calculation (min) 43 min   Equipment Utilized During Treatment Gait belt   Activity Tolerance Patient tolerated treatment well   Behavior During Therapy [facility] for tasks assessed/performed      Past Medical History  Diagnosis Date  . Childhood asthma   . Embolism - blood clot   . Spinal paraplegia (HCC)     No past surgical history on file.  There were no vitals filed for this visit.      Subjective Assessment - 03/12/16 1504    Subjective nothing new; no complaints   Currently in Pain? No/denies            [facility] - 03/12/16 1505    Standardized Balance Assessment   Standardized Balance Assessment Berg Balance Test   Berg Balance Test   Sit to Stand Able to stand without using hands and stabilize independently   Standing Unsupported Able to stand safely 2 minutes   Sitting with Back Unsupported but Feet Supported on Floor or Stool Able to sit safely and securely 2 minutes   Stand to Sit Controls descent by using hands   Transfers Able to transfer safely, definite need of hands   Standing Unsupported with Eyes Closed Able to stand 10 seconds safely   Standing Ubsupported with Feet Together Able to place feet together independently and stand for 1 minute with supervision   From Standing, Reach Forward with Outstretched Arm Can reach confidently >25 cm (10")   From Standing Position, Pick up Object from Floor  Able to pick up shoe, needs supervision   From Standing Position, Turn to Look Behind Over each Shoulder Looks behind one side only/other side shows less weight shift   Turn 360 Degrees Needs assistance while turning   Standing Unsupported, Alternately Place Feet on Step/Stool Needs assistance to keep from falling or unable to try   Standing Unsupported, One Foot in Front Able to plae foot ahead of the other independently and hold 30 seconds   Standing on One Leg Able to lift leg independently and hold equal to or more than 3 seconds  RLE 4 sec   Total Score 40                     [facility] Adult PT Treatment/Exercise - 03/12/16 1547    Ambulation/Gait   Gait Comments assessed gait without knee portion of KAFO and use of coban to simulate calf strap on AFO.  Pt able to amb in // bars mod I but experienced L knee recurvatum which pt reports increased pain.   Exercises   Exercises Knee/Hip   Knee/Hip Exercises: Supine   Quad Sets Left;10 reps   Quad Sets Limitations on physioball with hip extension   Bridges Both;10 reps   Bridges Limitations min A needed for LLE midline positioning   Single Leg Bridge Left;5 reps   Knee Flexion Both;10 reps  Knee Flexion Limitations on physioball with mod control of LLE; performed with both LEs and LLE only                  PT Short Term Goals - 03/12/16 1555    PT SHORT TERM GOAL #1   Title Pt will be IND in HEP to improve balance and strength. Target date: 03/31/16   Status On-going   PT SHORT TERM GOAL #2   Title Pt will improve gait speed to >/=1.84ft/sec. with LRAD to decr. falls risk. Target date: 03/31/16   Status On-going   PT SHORT TERM GOAL #3   Title Perform BERG and write goals. Target date: 03/31/16   Status Achieved   PT SHORT TERM GOAL #4   Title Pt will amb. 300' over even terrain with LRAD and AFO to improve functional mobility. Target date: 03/31/16   Status On-going   PT SHORT TERM GOAL #5   Title Pt will  traverse 12 steps with one handrail and AFO at MOD I level to negotiate steps at home. Target date: 03/31/16   Status On-going           PT Long Term Goals - 03/12/16 1555    PT LONG TERM GOAL #1   Title Pt will amb. 500' over paved and even surfaces with SPC and AFO at MOD I level in order to improve functional mobilty.Target date:04/28/16   Status On-going   PT LONG TERM GOAL #2   Title Pt will improve gait speed to >/=2.35ft/sec with LRAD and AFO to amb. safely in the community. Target date: 04/28/16   Status On-going   PT LONG TERM GOAL #3   Title Pt will amb. 86' with AFO and min guard and no AD to amb. safely at home. Target date: 04/28/16   Status On-going   PT LONG TERM GOAL #4   Title Pt will verbalize plans to join gym and/or play w/c basketball to maintain gains made in PT and improve quality of life. Target date: 04/28/16   Status On-going   PT LONG TERM GOAL #5   Title improve BERG balance score to > 45/56 for decreased fall risk. (04/28/16)   Status On-going               Plan - 03/12/16 1552    Clinical Impression Statement Pt scored a 40/56 on BERG balance test indicating high fall risk.  Pt also with significant L knee recurvatum without knee component of KAFO with reports on pain.  Pt will likely need custom solid ankle AFO and possbily heel wedge to control recurvatum if able to amb safely without KAFO.  Will continue to benefit from PT to maximize function.   PT Next Visit Plan provide balance/strength HEP - hamstring strengthening   PT Home Exercise Plan pt has supine HEP from previous OPPT-instructed to begin performing again as pt had stopped   Consulted and Agree with Plan of Care Patient      Patient will benefit from skilled therapeutic intervention in order to improve the following deficits and impairments:     Visit Diagnosis: Spastic hemiplegia affecting left dominant side (HCC)  Spastic hemiplegia affecting right nondominant side (HCC)  Other  abnormalities of gait and mobility     Problem List Patient Active Problem List   Diagnosis Date Noted  . 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 Food allergy 06/19/2012  . 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  Hypercoagulable state (HCC) 09/22/2011  . Neurogenic bladder 09/22/2011  . Neurogenic bowel 09/22/2011   Clarita Crane, PT, DPT 03/12/2016 3:57 PM  [facility] [address] [place], [place], [address] Phone: [phone]   Fax:  [phone]  Name: Donald Harvey MRN: [id number] Date of Birth: 1990/11/08

## 2016-03-13 ENCOUNTER — Ambulatory Visit: Payer: BLUE CROSS/BLUE SHIELD

## 2016-03-13 ENCOUNTER — Telehealth: Payer: Self-pay

## 2016-03-13 DIAGNOSIS — R2689 Other abnormalities of gait and mobility: Secondary | ICD-10-CM

## 2016-03-13 DIAGNOSIS — G8112 Spastic hemiplegia affecting left dominant side: Secondary | ICD-10-CM | POA: Diagnosis not present

## 2016-03-13 DIAGNOSIS — G8113 Spastic hemiplegia affecting right nondominant side: Secondary | ICD-10-CM

## 2016-03-13 NOTE — Telephone Encounter (Signed)
I had already sent Donald Harvey a note in this regard. I have already done a face to face visit this week. We can send over a script next week.   thx

## 2016-03-13 NOTE — Patient Instructions (Signed)
Single Leg - Eyes Open    Holding support, lift right leg while maintaining balance over other leg. Progress to removing hands from support surface for longer periods of time. Hold__10__ seconds. Repeat __3__ times per session per leg. Do __1__ sessions per day.  Copyright  VHI. All rights reserved.  Feet Together, Varied Arm Positions - Eyes Closed    Stand with feet together and arms at your side. Close eyes and visualize upright position. Hold _30___ seconds. Repeat __3__ times per session. Do __1__ sessions per day.  Copyright  VHI. All rights reserved.  Marching in Place: Varied Surfaces    March in place, slowly lifting knees toward ceiling. Hold onto counter for support. Repeat __10__ times per session. Do __1__ sessions per day.  Copyright  VHI. All rights reserved.   

## 2016-03-13 NOTE — Telephone Encounter (Signed)
Hello Dr. Riley KillSwartz~  I'm currently seeing Donald Harvey for PT. He would benefit from a new wheelchair and cushion, as his current cushion is worn down and w/c parts are no longer effective and safe. Could you please fax us an order for a wheelchair and cushion, and we will schedule a w/c appt.  Thank you, Donald Harvey, PT,DPT 03/13/2016 4:06 PM Phone: [phone] Fax: [phone]

## 2016-03-13 NOTE — Therapy (Signed)
[facility] [address] [place], [place], [address] Phone: [phone]   Fax:  [phone]  Physical Therapy Treatment  Patient Details  Name: Donald MorrowJeremy E Harvey MRN: [id number] Date of Birth: 09-04-1991 Referring Provider: Dr. Riley KillSwartz  Encounter Date: 03/13/2016      PT End of Session - 03/13/16 1453    Visit Number 3   Number of Visits 17   Date for PT Re-Evaluation 05/02/16   Authorization Type BCBS-emailed Misty StanleyLisa regarding benefits   PT Start Time 1404   PT Stop Time 1446   PT Time Calculation (min) 42 min   Activity Tolerance Patient tolerated treatment well   Behavior During Therapy [facility] for tasks assessed/performed      Past Medical History  Diagnosis Date  . Childhood asthma   . Embolism - blood clot   . Spinal paraplegia [facility](HCC)     History reviewed. No pertinent past surgical history.  There were no vitals filed for this visit.      Subjective Assessment - 03/13/16 1406    Subjective Pt denied falls or changes since last visit.   Pertinent History Hx of T6 SCI infarction   Patient Stated Goals To walk more   Currently in Pain? No/denies         Therex: In // bars with S with cues for technique. Pt performed sidestepping and L hamstring curls without L KAFO, x10/activity. Pt noted to experience incr. L foot inversion and genu recurvatum without L KAFO donned, therefore, exercises ceased. In sidelying: 3x10 hamstring curls with assist to prevent L foot eversion.  Neuro re-ed: Performed with 1-2 UE support and mat behind pt, and min guard to min A for safety: pt performed static standing: feet apart/together, eyes open closed, single leg stance and marches. Cues for technique. Please see pt instructions for details.                 PT Education - 03/13/16 1452    Education provided Yes   Education Details PT provided pt with balance HEP and asked pt to perform former strengthening  HEP before next session and report if HEP has become to easy,and PT will progress exercises.    Person(s) Educated Patient   Methods Explanation;Demonstration;Tactile cues;Verbal cues;Handout   Comprehension Returned demonstration;Verbalized understanding          PT Short Term Goals - 03/12/16 1555    PT SHORT TERM GOAL #1   Title Pt will be IND in HEP to improve balance and strength. Target date: 03/31/16   Status On-going   PT SHORT TERM GOAL #2   Title Pt will improve gait speed to >/=1.418ft/sec. with LRAD to decr. falls risk. Target date: 03/31/16   Status On-going   PT SHORT TERM GOAL #3   Title Perform BERG and write goals. Target date: 03/31/16   Status Achieved   PT SHORT TERM GOAL #4   Title Pt will amb. 300' over even terrain with LRAD and AFO to improve functional mobility. Target date: 03/31/16   Status On-going   PT SHORT TERM GOAL #5   Title Pt will traverse 12 steps with one handrail and AFO at MOD I level to negotiate steps at home. Target date: 03/31/16   Status On-going           PT Long Term Goals - 03/12/16 1555    PT LONG TERM GOAL #1   Title Pt will amb. 500' over paved and even surfaces with  SPC and AFO at MOD I level in order to improve functional mobilty.Target date:04/28/16   Status On-going   PT LONG TERM GOAL #2   Title Pt will improve gait speed to >/=2.59ft/sec with LRAD and AFO to amb. safely in the community. Target date: 04/28/16   Status On-going   PT LONG TERM GOAL #3   Title Pt will amb. 79' with AFO and min guard and no AD to amb. safely at home. Target date: 04/28/16   Status On-going   PT LONG TERM GOAL #4   Title Pt will verbalize plans to join gym and/or play w/c basketball to maintain gains made in PT and improve quality of life. Target date: 04/28/16   Status On-going   PT LONG TERM GOAL #5   Title improve BERG balance score to > 45/56 for decreased fall risk. (04/28/16)   Status On-going               Plan - 03/13/16 1454     Clinical Impression Statement Pt continues to require rest breaks to allow LLE tone to decr. Pt's L hamstrings are weak but were able to contract in gravity limited position. Pt continues to demonstrate incr. L foot inversion and L genu recurvatum in stance without L KAFO donned. Continue with POC.    Rehab Potential Good   Clinical Impairments Affecting Rehab Potential Hx of SCI infarct   PT Frequency 2x / week   PT Duration 8 weeks   PT Treatment/Interventions ADLs/Self Care Home Management;Biofeedback;Balance training;Manual techniques;Therapeutic exercise;Therapeutic activities;Orthotic Fit/Training;Functional mobility training;Stair training;Gait training;Wheelchair mobility training;Patient/family education;DME Instruction;Neuromuscular re-education   PT Next Visit Plan Gait with RW, trial L sidelying L hamstring exercise-if pt able to perform provide HEP   PT Home Exercise Plan pt has supine HEP from previous OPPT-instructed to begin performing again as pt had stopped, added balance HEP   Consulted and Agree with Plan of Care Patient      Patient will benefit from skilled therapeutic intervention in order to improve the following deficits and impairments:  Abnormal gait, Decreased endurance, Impaired flexibility, Decreased coordination, Decreased balance, Decreased mobility, Decreased skin integrity, Decreased knowledge of use of DME, Decreased strength, Impaired tone  Visit Diagnosis: Spastic hemiplegia affecting left dominant side (HCC)  Spastic hemiplegia affecting right nondominant side (HCC)  Other abnormalities of gait and mobility     Problem List Patient Active Problem List   Diagnosis Date Noted  . 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 Food allergy 06/19/2012  . 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 Hypercoagulable state (HCC) 09/22/2011  . Neurogenic bladder 09/22/2011  . Neurogenic bowel 09/22/2011     Donald Harvey L 03/13/2016, 3:53 PM  [facility] [address] [place], [place], [address] Phone: [phone]   Fax:  [phone]  Name: KOLTEN RYBACK MRN: [id number] Date of Birth: 1991/01/07    Zerita Boers, PT,DPT 03/13/2016 3:53 PM Phone: [phone] Fax: [phone]

## 2016-03-20 ENCOUNTER — Ambulatory Visit (HOSPITAL_BASED_OUTPATIENT_CLINIC_OR_DEPARTMENT_OTHER): Payer: BLUE CROSS/BLUE SHIELD | Admitting: Oncology

## 2016-03-20 ENCOUNTER — Ambulatory Visit: Payer: BLUE CROSS/BLUE SHIELD | Admitting: Physical Therapy

## 2016-03-20 VITALS — BP 127/72 | HR 103 | Temp 98.1°F | Resp 18 | Ht 74.0 in | Wt 216.5 lb

## 2016-03-20 DIAGNOSIS — G8220 Paraplegia, unspecified: Secondary | ICD-10-CM | POA: Diagnosis not present

## 2016-03-20 DIAGNOSIS — G08 Intracranial and intraspinal phlebitis and thrombophlebitis: Secondary | ICD-10-CM | POA: Diagnosis not present

## 2016-03-20 DIAGNOSIS — R2689 Other abnormalities of gait and mobility: Secondary | ICD-10-CM

## 2016-03-20 DIAGNOSIS — M6281 Muscle weakness (generalized): Secondary | ICD-10-CM

## 2016-03-20 DIAGNOSIS — D6859 Other primary thrombophilia: Secondary | ICD-10-CM

## 2016-03-20 DIAGNOSIS — G8112 Spastic hemiplegia affecting left dominant side: Secondary | ICD-10-CM

## 2016-03-20 NOTE — Progress Notes (Signed)
Please see consult note.  

## 2016-03-20 NOTE — Consult Note (Signed)
Reason for Referral: History of thrombosis.   HPI: 25 year old gentleman currently of [place] where he lived the majority of his life. He was attending college in [place] when he acutely developed lower extremity weakness and numbness in September 2012. 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 Angiogram of the brain did show a right-sided noninclusive thrombus in the transverse and sphenoid sinus which suggested potential hypercoagulable state. His workup was essentially unrevealing but was started on warfarin at that time. After that. We have he established care locally in [place] and was resumed on warfarin. He reports that he has been off of it for the last 3 years. He has not reported any recent thrombotic events and have been recovering nicely from his spinal cord infarct. He still have some weakness in his lower extremities as well as spastic hemiparesis. He is currently under the care of Dr. Riley KillSwartz regarding this issue. He has recently graduated from college and have relocated back home in December 2016. He denied any thrombotic events before or since that time. Has not reported any phlebitis, DVT, PE, vascular headaches, migraines. He denied any family history of thrombosis. He does report that his brother received solid organ transplant and died of complications related to it. It is unclear with the complication was at that time but he was told that could potentially related to a blood clot.  He does not report any headaches, blurry vision, syncope or seizures. He does not report any fevers, chills or sweats. Does not report any cough, wheezing or hemoptysis. Does not report any nausea, vomiting or abdominal pain. Does not report any frequency, urgency or hesitancy. She  does not report any skeletal complaints of arthralgias or myalgias. He does report spasms related with his paresthesia. Remaining review of systems unremarkable.   Past Medical History  Diagnosis Date  . Childhood asthma   . Embolism - blood clot   . Spinal paraplegia (HCC)   :  No past surgical history on file.:   Current outpatient prescriptions:  .  acetaminophen (TYLENOL) 500 MG tablet, Take 1,000 mg by mouth as needed., Disp: , Rfl:  .  clonazePAM (KLONOPIN) 0.5 MG tablet, Take 1 tablet (0.5 mg total) by mouth 2 (two) times daily., Disp: 60 tablet, Rfl: 2 .  clonazePAM (KLONOPIN) 1 MG tablet, Take 1 tablet (1 mg total) by mouth 2 (two) times daily., Disp: 60 tablet, Rfl: 2:  Allergies  Allergen Reactions  . Shellfish Allergy   :  No family history on file.:  Social History   Social History  . Marital Status: Single    Spouse Name: N/A  . Number of Children: N/A  . Years of Education: N/A   Occupational History  . Not on file.   Social History Main Topics  . Smoking status: Never Smoker   . Smokeless tobacco: Never Used  . Alcohol Use: Yes  . Drug Use: No  . Sexual Activity: Not on file   Other Topics Concern  . Not on file   Social History Narrative  :  Pertinent items are noted in HPI.  Exam: Blood pressure 127/72, pulse 103, temperature 98.1 F (36.7 C), temperature source Oral, resp. rate 18, height 6\' 2"  (1.88 m), weight 216 lb 8 oz (98.204 kg), SpO2 99 %. General appearance:  alert and cooperative appeared without distress. Head: Normocephalic, without obvious abnormality Neck: no adenopathy.  Back: symmetric, no curvature. ROM normal. No CVA tenderness. Resp: clear to auscultation bilaterally Chest wall: no tenderness Cardio: regular rate and rhythm, S1, S2 normal, no murmur, click, rub or gallop GI: soft, non-tender; bowel sounds normal; no masses,  no organomegaly Extremities: extremities normal, atraumatic, no cyanosis or edema Pulses: 2+ and  symmetric Skin: Skin color, texture, turgor normal. No rashes or lesions Lymph nodes: Cervical, supraclavicular, and axillary nodes normal.      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 This resulted into a spastic paraparesis and chronic weakness in his lower extremities. His neurological symptoms are improving slowly.  He was treated with full dose warfarin between 2012 and 2013 but have been off of it for his report close to 3 years. The indication for full dose anticoagulation was discussed today. He does have a rather severe and idiopathic thrombosis not only in his sinuses but also in the spinal cord vasculature. These findings would suggest lifetime dose anticoagulation. However, he had been reluctant to do so and essentially been off anticoagulation without any thrombotic events since then.   The risks and benefits of continue full dose anticoagulation was reviewed and for this time. Full dose anticoagulation certainly will offer him protection from any repeated events but could be catastrophic in the future. On the other hand, he understands long-term anticoagulation is associated with increased bleeding risk which he has experienced including bleeding per rectum.  After discussion today he elected to take low-dose aspirin instead of full dose anticoagulation. He understands of he develops any other episodes of thrombosis he will need full dose anticoagulation lifetime.

## 2016-03-21 NOTE — Therapy (Signed)
[facility] [address] [place], [place], [address] Phone: [phone]   Fax:  [phone]  Physical Therapy Treatment  Patient Details  Name: Donald Harvey MRN: [id number] Date of Birth: Aug 17, 1991 Referring Provider: Dr. Riley Kill  Encounter Date: 03/20/2016      PT End of Session - 03/21/16 1636    Visit Number 4   Number of Visits 17   Date for PT Re-Evaluation 05/02/16   Authorization Type BCBS-emailed Misty Stanley regarding benefits   PT Start Time 1017   PT Stop Time 1101   PT Time Calculation (min) 44 min   Equipment Utilized During Treatment Gait belt      Past Medical History  Diagnosis Date  . Childhood asthma   . Embolism - blood clot   . Spinal paraplegia (HCC)     No past surgical history on file.  There were no vitals filed for this visit.      Subjective Assessment - 03/21/16 1623    Subjective Pt states he is standing and doing balance exercises at home but not walking very much   Pertinent History Hx of T6 SCI infarction   Patient Stated Goals To walk more   Currently in Pain? No/denies                                [facility] Adult PT Treatment/Exercise - 03/21/16 0001    Ambulation/Gait   Ambulation/Gait Yes   Ambulation/Gait Assistance 5: Supervision   Ambulation/Gait Assistance Details KAFO on LLE   Ambulation Distance (Feet) 240 Feet   Assistive device Rolling walker   Knee/Hip Exercises: Aerobic   Stationary Bike SciFit level 2.0 x 5" with all 4 extremities     Sidestepping inside parallel bars 10' x4 reps with UE support prn with KAFO on LLE  Backwards amb. 10' x 2 reps with KAFO on LLE with RUE support  Pt performed bridging x 5 reps, bridging with RLE extension and bridging with hip abduction/adduction x 5 reps each L knee to chest with resistance in flexion and extension x 10 reps   Pt performed L knee flexion in sidelying with pillows between knees  (powder board was in use at this time) 2 sets 10 reps; Used powder board with 3# weight on LLE for approx. 7 reps prior to fatigue, then weight removed and pt performed 3 additional reps  Without weight and 1 more set 10 reps no weight for L knee flexion  Instructed pt in how to perform this exercise at home but he repeatedly stated he did not think he would be able to do so                PT Short Term Goals - 03/12/16 1555    PT SHORT TERM GOAL #1   Title Pt will be IND in HEP to improve balance and strength. Target date: 03/31/16   Status On-going   PT SHORT TERM GOAL #2   Title Pt will improve gait speed to >/=1.78ft/sec. with LRAD to decr. falls risk. Target date: 03/31/16   Status On-going   PT SHORT TERM GOAL #3   Title Perform BERG and write goals. Target date: 03/31/16   Status Achieved   PT SHORT TERM GOAL #4   Title Pt will amb. 300' over even terrain with LRAD and AFO to improve functional mobility. Target date: 03/31/16   Status On-going   PT  SHORT TERM GOAL #5   Title Pt will traverse 12 steps with one handrail and AFO at MOD I level to negotiate steps at home. Target date: 03/31/16   Status On-going           PT Long Term Goals - 03/12/16 1555    PT LONG TERM GOAL #1   Title Pt will amb. 500' over paved and even surfaces with SPC and AFO at MOD I level in order to improve functional mobilty.Target date:04/28/16   Status On-going   PT LONG TERM GOAL #2   Title Pt will improve gait speed to >/=2.6862ft/sec with LRAD and AFO to amb. safely in the community. Target date: 04/28/16   Status On-going   PT LONG TERM GOAL #3   Title Pt will amb. 5550' with AFO and min guard and no AD to amb. safely at home. Target date: 04/28/16   Status On-going   PT LONG TERM GOAL #4   Title Pt will verbalize plans to join gym and/or play w/c basketball to maintain gains made in PT and improve quality of life. Target date: 04/28/16   Status On-going   PT LONG TERM GOAL #5   Title  improve BERG balance score to > 45/56 for decreased fall risk. (04/28/16)   Status On-going               Plan - 03/21/16 1638    Clinical Impression Statement Pt has significant increased L foot supination/inversion due to spasticity - L foot inverts without KAFO while performing Scifit evercise - manuallly supinated L foot which helped to decrease discomfort; spasticity in LLE limits activity and standing tolerance   Rehab Potential Good   Clinical Impairments Affecting Rehab Potential Hx of SCI infarct   PT Frequency 2x / week   PT Duration 8 weeks   PT Treatment/Interventions ADLs/Self Care Home Management;Biofeedback;Balance training;Manual techniques;Therapeutic exercise;Therapeutic activities;Orthotic Fit/Training;Functional mobility training;Stair training;Gait training;Wheelchair mobility training;Patient/family education;DME Instruction;Neuromuscular re-education   PT Next Visit Plan Provide L sidelying hamstring exercise (pt stated he did not feel that he would be able to do this exercise at home despite the fact that he was told he could do with pillow s between  his knees   PT Home Exercise Plan pt has supine HEP from previous OPPT-instructed to begin performing again as pt had stopped, added balance HEP   Consulted and Agree with Plan of Care Patient      Patient will benefit from skilled therapeutic intervention in order to improve the following deficits and impairments:  Abnormal gait, Decreased endurance, Impaired flexibility, Decreased coordination, Decreased balance, Decreased mobility, Decreased skin integrity, Decreased knowledge of use of DME, Decreased strength, Impaired tone  Visit Diagnosis: Spastic hemiplegia affecting left dominant side (HCC)  Other abnormalities of gait and mobility     Problem List Patient Active Problem List   Diagnosis Date Noted  . 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 Food allergy  06/19/2012  . 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 Hypercoagulable state (HCC) 09/22/2011  . Neurogenic bladder 09/22/2011  . Neurogenic bowel 09/22/2011    Kary KosDilday, Talley Casco Suzanne, PT  03/21/2016, 4:58 PM  [facility] [address] [place], [place], [address] Phone: [phone]   Fax:  [phone]  Name: Modena MorrowJeremy E Harvey MRN: [id number] Date of Birth: 19-May-1991

## 2016-03-24 ENCOUNTER — Ambulatory Visit: Payer: BLUE CROSS/BLUE SHIELD | Admitting: Physical Therapy

## 2016-03-24 DIAGNOSIS — G8112 Spastic hemiplegia affecting left dominant side: Secondary | ICD-10-CM | POA: Diagnosis not present

## 2016-03-24 DIAGNOSIS — G8113 Spastic hemiplegia affecting right nondominant side: Secondary | ICD-10-CM

## 2016-03-24 DIAGNOSIS — R2689 Other abnormalities of gait and mobility: Secondary | ICD-10-CM

## 2016-03-24 NOTE — Therapy (Signed)
[facility] [address] [place], [place], [address] Phone: [phone]   Fax:  [phone]  Physical Therapy Treatment  Patient Details  Name: Modena MorrowJeremy E Wermuth MRN: [id number] Date of Birth: 1991-01-31 Referring Provider: Dr. Riley KillSwartz  Encounter Date: 03/24/2016      PT End of Session - 03/24/16 1316    Visit Number 5   Number of Visits 17   Date for PT Re-Evaluation 05/02/16   Authorization Type BCBS-emailed Misty StanleyLisa regarding benefits   PT Start Time 1230   PT Stop Time 1315   PT Time Calculation (min) 45 min   Activity Tolerance Patient tolerated treatment well   Behavior During Therapy [facility] for tasks assessed/performed      Past Medical History  Diagnosis Date  . Childhood asthma   . Embolism - blood clot   . Spinal paraplegia (HCC)     No past surgical history on file.  There were no vitals filed for this visit.      Subjective Assessment - 03/24/16 1237    Subjective no complaints; nothing new                         [facility] Adult PT Treatment/Exercise - 03/24/16 1238    Ambulation/Gait   Ambulation/Gait Yes   Ambulation/Gait Assistance 5: Supervision   Ambulation/Gait Assistance Details KAFO on RLE   Ambulation Distance (Feet) 115 Feet   Assistive device Rolling walker   Gait Comments cues for heel strike on LLE   Knee/Hip Exercises: Stretches   Quad Stretch Left;3 reps;30 seconds   Hip Flexor Stretch 3 reps;Left;30 seconds   Knee/Hip Exercises: Sidelying   Other Sidelying Knee/Hip Exercises LLE hamstring curl 2x10 (x10 no weight; x 10 3#)   Other Sidelying Knee/Hip Exercises LLE hip ext/flex x 10 3#; L hip flexion 3# x 10                  PT Short Term Goals - 03/12/16 1555    PT SHORT TERM GOAL #1   Title Pt will be IND in HEP to improve balance and strength. Target date: 03/31/16   Status On-going   PT SHORT TERM GOAL #2   Title Pt will improve gait speed to  >/=1.18ft/sec. with LRAD to decr. falls risk. Target date: 03/31/16   Status On-going   PT SHORT TERM GOAL #3   Title Perform BERG and write goals. Target date: 03/31/16   Status Achieved   PT SHORT TERM GOAL #4   Title Pt will amb. 300' over even terrain with LRAD and AFO to improve functional mobility. Target date: 03/31/16   Status On-going   PT SHORT TERM GOAL #5   Title Pt will traverse 12 steps with one handrail and AFO at MOD I level to negotiate steps at home. Target date: 03/31/16   Status On-going           PT Long Term Goals - 03/12/16 1555    PT LONG TERM GOAL #1   Title Pt will amb. 500' over paved and even surfaces with SPC and AFO at MOD I level in order to improve functional mobilty.Target date:04/28/16   Status On-going   PT LONG TERM GOAL #2   Title Pt will improve gait speed to >/=2.3262ft/sec with LRAD and AFO to amb. safely in the community. Target date: 04/28/16   Status On-going   PT LONG TERM GOAL #3   Title Pt will amb.  17' with AFO and min guard and no AD to amb. safely at home. Target date: 04/28/16   Status On-going   PT LONG TERM GOAL #4   Title Pt will verbalize plans to join gym and/or play w/c basketball to maintain gains made in PT and improve quality of life. Target date: 04/28/16   Status On-going   PT LONG TERM GOAL #5   Title improve BERG balance score to > 45/56 for decreased fall risk. (04/28/16)   Status On-going               Plan - 03/24/16 1316    Clinical Impression Statement Pt with increased L foot supination/inversion due to spasticity but limiting to function and gait. Unable to perform sidelying exercises without board.   PT Next Visit Plan Provide L sidelying hamstring exercise (pt stated he did not feel that he would be able to do this exercise at home despite the fact that he was told he could do with pillow s between  his knees      Patient will benefit from skilled therapeutic intervention in order to improve the following  deficits and impairments:     Visit Diagnosis: Spastic hemiplegia affecting left dominant side (HCC)  Other abnormalities of gait and mobility  Spastic hemiplegia affecting right nondominant side [facility])     Problem List Patient Active Problem List   Diagnosis Date Noted  . 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 Food allergy 06/19/2012  . 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 Hypercoagulable state (HCC) 09/22/2011  . Neurogenic bladder 09/22/2011  . Neurogenic bowel 09/22/2011   Clarita Crane, PT, DPT 03/24/2016 1:18 PM  [facility] [address] [place], [place], [address] Phone: [phone]   Fax:  [phone]  Name: JAZON JIPSON MRN: [id number] Date of Birth: Sep 22, 1991

## 2016-03-26 ENCOUNTER — Ambulatory Visit: Payer: BLUE CROSS/BLUE SHIELD

## 2016-03-26 DIAGNOSIS — R2689 Other abnormalities of gait and mobility: Secondary | ICD-10-CM

## 2016-03-26 DIAGNOSIS — G8112 Spastic hemiplegia affecting left dominant side: Secondary | ICD-10-CM

## 2016-03-26 NOTE — Therapy (Signed)
[facility] [address] [place], [place], [address] Phone: [phone]   Fax:  [phone]  Physical Therapy Treatment  Patient Details  Name: Donald MorrowJeremy E Drohan MRN: [id number] Date of Birth: 09-May-1991 Referring Provider: Dr. Riley KillSwartz  Encounter Date: 03/26/2016      PT End of Session - 03/26/16 1637    Visit Number 6   Number of Visits 17   Date for PT Re-Evaluation 05/02/16   Authorization Type BCBS-30 visit limit combo   Authorization - Visit Number 6   Authorization - Number of Visits 30   PT Start Time 1316   PT Stop Time 1358   PT Time Calculation (min) 42 min   Equipment Utilized During Treatment --  pt refused gait belt, PT utilized min guard to S and rehab tech   Activity Tolerance Patient limited by pain   Behavior During Therapy [facility] for tasks assessed/performed      Past Medical History  Diagnosis Date  . Childhood asthma   . Embolism - blood clot   . Spinal paraplegia [facility](HCC)     History reviewed. No pertinent past surgical history.  There were no vitals filed for this visit.      Subjective Assessment - 03/26/16 1319    Subjective Pt denied falls since last visit. Pt reported he feels his L hamstring starting to contract more.  Pt prefers to try [employer] to obtain a new L KAFO or AFO, as he did not like the KAFO [facility] provided.   Pertinent History Hx of T6 SCI infarction   Patient Stated Goals To walk more   Currently in Pain? No/denies                         [facility] Adult PT Treatment/Exercise - 03/26/16 1324    Ambulation/Gait   Ambulation/Gait Yes   Ambulation/Gait Assistance 5: Supervision;4: Min guard;4: Min assist   Ambulation/Gait Assistance Details Pt amb. with forearm crutches in // bars and overground. Pt reported 3-4/10 ankle pain after amb. and required seated rest break. Pt reported L foot pain (plantar surface) 7/10 during amb, required seated rest break. Pt.  amb. 230' with FA crutches, L KAFO donned, and simulated shoe cap. Cues for sequencing, improve L toe clearance and upright posture. Pt noted to experience improve L toe clearance with toe cap.   Ambulation Distance (Feet) 230 Feet  x2' with forearm crutches and 75x2 with RW   Assistive device Rolling walker;R Forearm Crutch;L Forearm Crutch  and R forearm crutch                PT Education - 03/26/16 1636    Education provided Yes   Education Details PT discussed orthotic consult for new L KAFO, as current KAFO causes incr. L lateral foot and ankle pain. Pt prefers to try [employer] as he has used [facility] in the past.    Person(s) Educated Patient   Methods Explanation   Comprehension Verbalized understanding          PT Short Term Goals - 03/12/16 1555    PT SHORT TERM GOAL #1   Title Pt will be IND in HEP to improve balance and strength. Target date: 03/31/16   Status On-going   PT SHORT TERM GOAL #2   Title Pt will improve gait speed to >/=1.408ft/sec. with LRAD to decr. falls risk. Target date: 03/31/16   Status On-going   PT SHORT TERM GOAL #3  Title Perform BERG and write goals. Target date: 03/31/16   Status Achieved   PT SHORT TERM GOAL #4   Title Pt will amb. 300' over even terrain with LRAD and AFO to improve functional mobility. Target date: 03/31/16   Status On-going   PT SHORT TERM GOAL #5   Title Pt will traverse 12 steps with one handrail and AFO at MOD I level to negotiate steps at home. Target date: 03/31/16   Status On-going           PT Long Term Goals - 03/12/16 1555    PT LONG TERM GOAL #1   Title Pt will amb. 500' over paved and even surfaces with SPC and AFO at MOD I level in order to improve functional mobilty.Target date:04/28/16   Status On-going   PT LONG TERM GOAL #2   Title Pt will improve gait speed to >/=2.86ft/sec with LRAD and AFO to amb. safely in the community. Target date: 04/28/16   Status On-going   PT LONG TERM GOAL #3    Title Pt will amb. 67' with AFO and min guard and no AD to amb. safely at home. Target date: 04/28/16   Status On-going   PT LONG TERM GOAL #4   Title Pt will verbalize plans to join gym and/or play w/c basketball to maintain gains made in PT and improve quality of life. Target date: 04/28/16   Status On-going   PT LONG TERM GOAL #5   Title improve BERG balance score to > 45/56 for decreased fall risk. (04/28/16)   Status On-going               Plan - 03/26/16 1638    Clinical Impression Statement Pt demonstrated progress, as he was able to progress to utilizing forearm crutches vs. RW during gait. However, pt limited due to L foot and ankle pain 2/2 L KAFO and required seated rest breaks to decr. pain. PT will request prescription from MD and contact [employer] to set up an orthotic consult. Continue with POC.    Rehab Potential Good   Clinical Impairments Affecting Rehab Potential Hx of SCI infarct   PT Frequency 2x / week   PT Duration 8 weeks   PT Treatment/Interventions ADLs/Self Care Home Management;Biofeedback;Balance training;Manual techniques;Therapeutic exercise;Therapeutic activities;Orthotic Fit/Training;Functional mobility training;Stair training;Gait training;Wheelchair mobility training;Patient/family education;DME Instruction;Neuromuscular re-education   PT Next Visit Plan Assess STGs and continue gait with FA crutches   PT Home Exercise Plan pt has supine HEP from previous OPPT-instructed to begin performing again as pt had stopped, added balance HEP   Consulted and Agree with Plan of Care Patient      Patient will benefit from skilled therapeutic intervention in order to improve the following deficits and impairments:  Abnormal gait, Decreased endurance, Impaired flexibility, Decreased coordination, Decreased balance, Decreased mobility, Decreased skin integrity, Decreased knowledge of use of DME, Decreased strength, Impaired tone  Visit Diagnosis: Other abnormalities of  gait and mobility  Spastic hemiplegia affecting left dominant side [facility])     Problem List Patient Active Problem List   Diagnosis Date Noted  . 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 Food allergy 06/19/2012  . 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 Hypercoagulable state (HCC) 09/22/2011  . Neurogenic bladder 09/22/2011  . Neurogenic bowel 09/22/2011    Dayannara Pascal L 03/26/2016, 4:41 PM  [facility] [address] [place], [place], [address] Phone: [phone]   Fax:  [phone]  Name: SOLON ALBAN MRN: [id number] Date of Birth: Oct 30, 1991    Zerita Boers, PT,DPT 03/26/2016 4:41 PM Phone: [phone] Fax: [phone]

## 2016-03-31 ENCOUNTER — Ambulatory Visit: Payer: BLUE CROSS/BLUE SHIELD | Admitting: Physical Therapy

## 2016-03-31 DIAGNOSIS — G8112 Spastic hemiplegia affecting left dominant side: Secondary | ICD-10-CM | POA: Diagnosis not present

## 2016-03-31 DIAGNOSIS — R2689 Other abnormalities of gait and mobility: Secondary | ICD-10-CM

## 2016-03-31 NOTE — Therapy (Signed)
[facility] [address] [place], [place], [address] Phone: [phone]   Fax:  [phone]  Physical Therapy Treatment  Patient Details  Name: Donald Harvey MRN: [id number] Date of Birth: 1991-06-15 Referring Provider: Dr. Riley KillSwartz  Encounter Date: 03/31/2016      PT End of Session - 03/31/16 1619    Visit Number 7   Number of Visits 17   PT Start Time 1316   PT Stop Time 1401   PT Time Calculation (min) 45 min   Equipment Utilized During Treatment Gait belt   Activity Tolerance Patient tolerated treatment well      Past Medical History  Diagnosis Date  . Childhood asthma   . Embolism - blood clot   . Spinal paraplegia (HCC)     No past surgical history on file.  There were no vitals filed for this visit.      Subjective Assessment - 03/31/16 1615    Subjective He stated no falls;  does most of his functional activity from his wheel chair vs; using the kafo due to the pain it causes on the lateral dorsal side of his left foot/ side of the arch; with standing he struggels with getting the tone to reduce and not stand on the outside of his foot; he stated his is always looking for skin breatkdown; he stated he liked the forearm crutches he used last time- he does not like using the gait belt   Currently in Pain? No/denies         There ex;  Stand to sit squats; focus on eccentric w/ and w/o brace;  1/4 squat with 5 second hold--standing  Weight shift w/ and w/o UE support; assessed pain and brace function;  Walk w/ kafo  In parallel bars x 3 bouts  ;  amb 115' With forearm crutches  Reminder on transfer safety;                         PT Education - 03/31/16 1617    Education provided Yes   Education Details Instructed in how he can use stand to sit squats eccentric phase to help with LE control and strengthening; also to try this out of the KAFO with emphasis on getting his left  foot flat and reduce the tone   Person(s) Educated Patient   Methods Demonstration   Comprehension Returned demonstration          PT Short Term Goals - 03/12/16 1555    PT SHORT TERM GOAL #1   Title Pt will be IND in HEP to improve balance and strength. Target date: 03/31/16   Status On-going   PT SHORT TERM GOAL #2   Title Pt will improve gait speed to >/=1.468ft/sec. with LRAD to decr. falls risk. Target date: 03/31/16   Status On-going   PT SHORT TERM GOAL #3   Title Perform BERG and write goals. Target date: 03/31/16   Status Achieved   PT SHORT TERM GOAL #4   Title Pt will amb. 300' over even terrain with LRAD and AFO to improve functional mobility. Target date: 03/31/16   Status On-going   PT SHORT TERM GOAL #5   Title Pt will traverse 12 steps with one handrail and AFO at MOD I level to negotiate steps at home. Target date: 03/31/16   Status On-going           PT Long Term Goals - 03/12/16 1555  PT LONG TERM GOAL #1   Title Pt will amb. 500' over paved and even surfaces with SPC and AFO at MOD I level in order to improve functional mobilty.Target date:04/28/16   Status On-going   PT LONG TERM GOAL #2   Title Pt will improve gait speed to >/=2.59ft/sec with LRAD and AFO to amb. safely in the community. Target date: 04/28/16   Status On-going   PT LONG TERM GOAL #3   Title Pt will amb. 44' with AFO and min guard and no AD to amb. safely at home. Target date: 04/28/16   Status On-going   PT LONG TERM GOAL #4   Title Pt will verbalize plans to join gym and/or play w/c basketball to maintain gains made in PT and improve quality of life. Target date: 04/28/16   Status On-going   PT LONG TERM GOAL #5   Title improve BERG balance score to > 45/56 for decreased fall risk. (04/28/16)   Status On-going               Plan - 03/31/16 1620    Clinical Impression Statement Pt demod good contorl of 1/4 squats/ Le control below 1/4 he had poor control but is able to sit with  moderate contorl of descent to regular height chair;  he is able to slighlty evert his left foot-encouraged him to cont to work on this;  although initially difficult he was able to get his left foot positoined in neutral with standing and then complete a squat in this alignment;  If the brace can be adjusted so it doesnt cause pain  he coud walk functionally       Patient will benefit from skilled therapeutic intervention in order to improve the following deficits and impairments:     Visit Diagnosis: Other abnormalities of gait and mobility     Problem List Patient Active Problem List   Diagnosis Date Noted  . 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 Food allergy 06/19/2012  . 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 Hypercoagulable state (HCC) 09/22/2011  . Neurogenic bladder 09/22/2011  . Neurogenic bowel 09/22/2011    Vashti Hey D PT DPT 03/31/2016, 4:29 PM  [facility] [address] [place], [place], [address] Phone: [phone]   Fax:  [phone]  Name: Donald Harvey MRN: [id number] Date of Birth: 1991-05-29

## 2016-04-02 ENCOUNTER — Ambulatory Visit: Payer: BLUE CROSS/BLUE SHIELD | Attending: Internal Medicine

## 2016-04-02 DIAGNOSIS — G8112 Spastic hemiplegia affecting left dominant side: Secondary | ICD-10-CM | POA: Insufficient documentation

## 2016-04-02 DIAGNOSIS — R2689 Other abnormalities of gait and mobility: Secondary | ICD-10-CM | POA: Insufficient documentation

## 2016-04-02 DIAGNOSIS — G8113 Spastic hemiplegia affecting right nondominant side: Secondary | ICD-10-CM | POA: Insufficient documentation

## 2016-04-02 NOTE — Therapy (Signed)
[facility] [address] [place], [place], [address] Phone: [phone]   Fax:  [phone]  Physical Therapy Treatment  Patient Details  Name: Donald Harvey MRN: [id number] Date of Birth: 1990/12/11 Referring Provider: Dr. Naaman Plummer  Encounter Date: 04/02/2016      PT End of Session - 04/02/16 1514    Visit Number 8   Number of Visits 17   Date for PT Re-Evaluation 05/02/16   Authorization Type [employer]-30 visit limit combo   Authorization - Visit Number 7   Authorization - Number of Visits 30   PT Start Time 6629   PT Stop Time 1402   PT Time Calculation (min) 46 min   Equipment Utilized During Treatment --  pt refused to wear gait belt   Activity Tolerance Patient tolerated treatment well   Behavior During Therapy [facility] for tasks assessed/performed      Past Medical History  Diagnosis Date  . Childhood asthma   . Embolism - blood clot   . Spinal paraplegia [facility])     History reviewed. No pertinent past surgical history.  There were no vitals filed for this visit.      Subjective Assessment - 04/02/16 1318    Subjective Pt denied falls or changes since last visit.        Pertinent History Hx of T6 SCI infarction   Patient Stated Goals To walk more   Currently in Pain? No/denies                         [facility] Adult PT Treatment/Exercise - 04/02/16 1326    Ambulation/Gait   Ambulation/Gait Yes   Ambulation/Gait Assistance 5: Supervision   Ambulation/Gait Assistance Details Cues to improve L knee flexion, pt required standing rest breaks to decr. L LE spasticity.    Ambulation Distance (Feet) 200 Feet  50x2   Assistive device Rolling walker   Gait Pattern Step-through pattern;Decreased weight shift to left;Decreased dorsiflexion - left;Decreased stride length;Trunk flexed;Left hip hike   Ambulation Surface Level;Indoor   Gait velocity 1.63f/sec.   Stairs Yes   Stairs Assistance 5:  Supervision   Stairs Assistance Details (indicate cue type and reason) S to ensure safety. Cues to improve ant. wt. shifting. Pt utilized step to technique to descend and alternating to ascend steps.   Stair Management Technique One rail Right;Alternating pattern;Step to pattern   Number of Stairs 12   Height of Stairs 6   Standardized Balance Assessment   Standardized Balance Assessment Berg Balance Test   Berg Balance Test   Sit to Stand Able to stand without using hands and stabilize independently   Standing Unsupported Able to stand safely 2 minutes   Sitting with Back Unsupported but Feet Supported on Floor or Stool Able to sit safely and securely 2 minutes   Stand to Sit Sits safely with minimal use of hands   Transfers Able to transfer safely, minor use of hands   Standing Unsupported with Eyes Closed Able to stand 10 seconds with supervision   Standing Ubsupported with Feet Together Able to place feet together independently and stand 1 minute safely   From Standing, Reach Forward with Outstretched Arm Can reach forward >12 cm safely (5")  7"   From Standing Position, Pick up Object from Floor Able to pick up shoe safely and easily   From Standing Position, Turn to Look Behind Over each Shoulder Looks behind one side only/other side shows less  weight shift   Turn 360 Degrees Needs close supervision or verbal cueing   Standing Unsupported, Alternately Place Feet on Step/Stool Needs assistance to keep from falling or unable to try   Standing Unsupported, One Foot in Front Able to take small step independently and hold 30 seconds   Standing on One Leg Able to lift leg independently and hold > 10 seconds  R LE: 10sec. and LLE: unable to attempt 2/2 tone   Total Score 44           Self Care:     PT Education - 04/02/16 1513    Education provided Yes   Education Details PT reviewed HEP and pt reported it is still challenging and that he added squats without L KAFO. PT discussed  goal progress. Pt stated he had botox to reduce L LE spasticity when he was in school and it helped reduce tone, and PT educated pt that he can speak with Dr. Naaman Plummer regarding trialing botox again to reduce spasticity and improve functionl mobility. PT did educate pt that we would take a 3 week break from PT after botox injections, and then resume therapy.   Person(s) Educated Patient   Methods Explanation   Comprehension Verbalized understanding          PT Short Term Goals - 04/02/16 1517    PT SHORT TERM GOAL #1   Title Pt will be IND in HEP to improve balance and strength. Target date: 03/31/16   Status Achieved   PT SHORT TERM GOAL #2   Title Pt will improve gait speed to >/=1.12f/sec. with LRAD to decr. falls risk. Target date: 03/31/16   Status Partially Met   PT SHORT TERM GOAL #3   Title Perform BERG and write goals. Target date: 03/31/16   Status Achieved   PT SHORT TERM GOAL #4   Title Pt will amb. 300' over even terrain with LRAD and AFO to improve functional mobility. Target date: 03/31/16   Status Deferred   PT SHORT TERM GOAL #5   Title Pt will traverse 12 steps with one handrail and AFO at MOD I level to negotiate steps at home. Target date: 03/31/16   Status Partially Met           PT Long Term Goals - 03/12/16 1555    PT LONG TERM GOAL #1   Title Pt will amb. 500' over paved and even surfaces with SPC and AFO at MOD I level in order to improve functional mobilty.Target date:04/28/16   Status On-going   PT LONG TERM GOAL #2   Title Pt will improve gait speed to >/=2.614fsec with LRAD and AFO to amb. safely in the community. Target date: 04/28/16   Status On-going   PT LONG TERM GOAL #3   Title Pt will amb. 5044with AFO and min guard and no AD to amb. safely at home. Target date: 04/28/16   Status On-going   PT LONG TERM GOAL #4   Title Pt will verbalize plans to join gym and/or play w/c basketball to maintain gains made in PT and improve quality of life. Target  date: 04/28/16   Status On-going   PT LONG TERM GOAL #5   Title improve BERG balance score to > 45/56 for decreased fall risk. (04/28/16)   Status On-going               Plan - 04/02/16 1515    Clinical Impression Statement Pt demonstrated progress, as he met  STG 1, and partially met STG 2 and 5. STG 4 deferred until pt has new L KAFO or AFO. Pt's functional mobility is limited 2/2 L LE spasticity, pt reported he experienced improvements after botox injections several years ago. PT edcuated pt that it would be best to speak to MD regarding botox injections, as that is outside PT scope of practice. Pt reported 6/10 L foot pain, after amb.which decr. With rest break. Continue with POC.    Rehab Potential Good   Clinical Impairments Affecting Rehab Potential Hx of SCI infarct   PT Frequency 2x / week   PT Duration 8 weeks   PT Treatment/Interventions ADLs/Self Care Home Management;Biofeedback;Balance training;Manual techniques;Therapeutic exercise;Therapeutic activities;Orthotic Fit/Training;Functional mobility training;Stair training;Gait training;Wheelchair mobility training;Patient/family education;DME Instruction;Neuromuscular re-education   PT Next Visit Plan continue gait with FA crutches   PT Home Exercise Plan pt has supine HEP from previous OPPT-instructed to begin performing again as pt had stopped, added balance HEP   Consulted and Agree with Plan of Care Patient      Patient will benefit from skilled therapeutic intervention in order to improve the following deficits and impairments:  Abnormal gait, Decreased endurance, Impaired flexibility, Decreased coordination, Decreased balance, Decreased mobility, Decreased skin integrity, Decreased knowledge of use of DME, Decreased strength, Impaired tone  Visit Diagnosis: Other abnormalities of gait and mobility  Spastic hemiplegia affecting left dominant side (HCC)  Spastic hemiplegia affecting right nondominant side  [facility])     Problem List Patient Active Problem List   Diagnosis Date Noted  . Spastic paraparesis ([place]) 05/31/2013  . Spinal cord infarction ([place]) 05/31/2013  . Food allergy 06/19/2012  . 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 Hypercoagulable state ([place]) 09/22/2011  . Neurogenic bladder 09/22/2011  . Neurogenic bowel 09/22/2011    Jamarie Mussa L 04/02/2016, 3:18 PM  [facility] [address] [place], [place], [address] Phone: [phone]   Fax:  [phone]  Name: Donald Harvey MRN: [id number] Date of Birth: Sep 11, 1991    Geoffry Paradise, PT,DPT 04/02/2016 3:18 PM Phone: [phone] Fax: [phone]

## 2016-04-06 ENCOUNTER — Ambulatory Visit: Payer: BLUE CROSS/BLUE SHIELD | Admitting: Rehabilitation

## 2016-04-06 ENCOUNTER — Encounter: Payer: Self-pay | Admitting: Rehabilitation

## 2016-04-06 DIAGNOSIS — R2689 Other abnormalities of gait and mobility: Secondary | ICD-10-CM

## 2016-04-06 DIAGNOSIS — G8112 Spastic hemiplegia affecting left dominant side: Secondary | ICD-10-CM

## 2016-04-06 NOTE — Therapy (Signed)
[facility] [address] [place], [place], [address] Phone: [phone]   Fax:  [phone]  Physical Therapy Treatment  Patient Details  Name: Donald Harvey MRN: [id number] Date of Birth: 21-Dec-1990 Referring Provider: Dr. Naaman Plummer  Encounter Date: 04/06/2016      PT End of Session - 04/06/16 1320    Visit Number 9   Number of Visits 17   Date for PT Re-Evaluation 05/02/16   Authorization Type [employer]-30 visit limit combo   Authorization - Visit Number 8   Authorization - Number of Visits 30   PT Start Time 3354   PT Stop Time 1402   PT Time Calculation (min) 44 min   Equipment Utilized During Treatment --  pt refused to wear gait belt   Activity Tolerance Patient tolerated treatment well   Behavior During Therapy [facility] for tasks assessed/performed      Past Medical History  Diagnosis Date  . Childhood asthma   . Embolism - blood clot   . Spinal paraplegia [facility])     History reviewed. No pertinent past surgical history.  There were no vitals filed for this visit.      Subjective Assessment - 04/06/16 1319    Subjective Pt denied falls or changes since last visit.        Pertinent History Hx of T6 SCI infarction   Patient Stated Goals To walk more   Currently in Pain? No/denies             Gait:  Continued to work on gait with FA crutches and KAFO.  Note that he continues to have significant compensations with L hip hike, circumduction, and L toe drag.  Pt continues to refuse use of toe cap, however feel that this may decrease compensations.  Min cues for posture and stepping strategy.    NMR:  Remainder of session focused on L knee control in standing as well as isolated activation on L hamstring and L hip flexor in standing and supine.  Performed standing L knee flex followed by hip flex to target in standing x 10 reps with assist required for adequate L knee flexion.  Transitioned to standing  posterior squats moving RLE posterior then anterior to work on L quad control.  Requires heavy assist at L ankle to maintain somewhat neutral alignment, also noted increased tone with increased fatigue.  Performed x 10 reps during session with cues to decrease ROM.  Continued to work on L knee control with L knee in slight flexion while RLE tapped to targets (with UE support) x 10 reps, again pt with increased difficulty due to L aknle instability.  Performed supine BLE bridging x 10 reps, supine BLE bridging on physioball with hamstring curls (assist needed for LLE) x 10 reps, ending with LLE only hamstring curls on ball x 10 reps with active assist needed.  Ended session with L terminal knee extension in // bars with red therband.  Pt feels that he can set this up at home.  Cues for posture and technique.                      PT Education - 04/06/16 1511    Education provided Yes   Education Details Continue to educate on follow up with orthotist regarding brace and also botox with Dr. Naaman Plummer.     Person(s) Educated Patient   Methods Explanation   Comprehension Verbalized understanding  PT Short Term Goals - 04/02/16 1517    PT SHORT TERM GOAL #1   Title Pt will be IND in HEP to improve balance and strength. Target date: 03/31/16   Status Achieved   PT SHORT TERM GOAL #2   Title Pt will improve gait speed to >/=1.43f/sec. with LRAD to decr. falls risk. Target date: 03/31/16   Status Partially Met   PT SHORT TERM GOAL #3   Title Perform BERG and write goals. Target date: 03/31/16   Status Achieved   PT SHORT TERM GOAL #4   Title Pt will amb. 300' over even terrain with LRAD and AFO to improve functional mobility. Target date: 03/31/16   Status Deferred   PT SHORT TERM GOAL #5   Title Pt will traverse 12 steps with one handrail and AFO at MOD I level to negotiate steps at home. Target date: 03/31/16   Status Partially Met           PT Long Term Goals - 03/12/16  1555    PT LONG TERM GOAL #1   Title Pt will amb. 500' over paved and even surfaces with SPC and AFO at MOD I level in order to improve functional mobilty.Target date:04/28/16   Status On-going   PT LONG TERM GOAL #2   Title Pt will improve gait speed to >/=2.67fsec with LRAD and AFO to amb. safely in the community. Target date: 04/28/16   Status On-going   PT LONG TERM GOAL #3   Title Pt will amb. 5038with AFO and min guard and no AD to amb. safely at home. Target date: 04/28/16   Status On-going   PT LONG TERM GOAL #4   Title Pt will verbalize plans to join gym and/or play w/c basketball to maintain gains made in PT and improve quality of life. Target date: 04/28/16   Status On-going   PT LONG TERM GOAL #5   Title improve BERG balance score to > 45/56 for decreased fall risk. (04/28/16)   Status On-going               Plan - 04/06/16 1512    Clinical Impression Statement Skilled session focused on L knee control/strength in standing and supine.  Pt continues to have increased tone at L ankle, making any standing exercise difficult.     Rehab Potential Good   Clinical Impairments Affecting Rehab Potential Hx of SCI infarct   PT Frequency 2x / week   PT Duration 8 weeks   PT Treatment/Interventions ADLs/Self Care Home Management;Biofeedback;Balance training;Manual techniques;Therapeutic exercise;Therapeutic activities;Orthotic Fit/Training;Functional mobility training;Stair training;Gait training;Wheelchair mobility training;Patient/family education;DME Instruction;Neuromuscular re-education   PT Next Visit Plan continue gait with FA crutches, break gait down in // bars   PT Home Exercise Plan pt has supine HEP from previous OPPT-instructed to begin performing again as pt had stopped, added balance HEP   Consulted and Agree with Plan of Care Patient      Patient will benefit from skilled therapeutic intervention in order to improve the following deficits and impairments:  Abnormal  gait, Decreased endurance, Impaired flexibility, Decreased coordination, Decreased balance, Decreased mobility, Decreased skin integrity, Decreased knowledge of use of DME, Decreased strength, Impaired tone  Visit Diagnosis: Other abnormalities of gait and mobility  Sp[facility]iplegia affecting left dominant side ([facility]    Problem List Patient Active Problem List   Diagnosis Date Noted  . Spastic paraparesis ([facility]/30/2014  . Spinal cord infarction ([facility]/30/2014  . Food allergy 06/19/2012  .  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 Hypercoagulable state ([place]) 09/22/2011  . Neurogenic bladder 09/22/2011  . Neurogenic bowel 09/22/2011    Cameron Sprang, PT, MPT [facility] [address] [place], [place], [address] Phone: [phone]   Fax:  [phone] 04/06/2016, 3:15 PM  Name: Donald Harvey MRN: [id number] Date of Birth: 1991/05/19

## 2016-04-09 ENCOUNTER — Ambulatory Visit: Payer: BLUE CROSS/BLUE SHIELD | Admitting: Physical Therapy

## 2016-04-09 ENCOUNTER — Ambulatory Visit: Payer: BLUE CROSS/BLUE SHIELD | Admitting: Rehabilitation

## 2016-04-09 DIAGNOSIS — R2689 Other abnormalities of gait and mobility: Secondary | ICD-10-CM

## 2016-04-09 DIAGNOSIS — G8112 Spastic hemiplegia affecting left dominant side: Secondary | ICD-10-CM

## 2016-04-09 NOTE — Therapy (Signed)
[facility] [address] [place], [place], [address] Phone: [phone]   Fax:  [phone]  Physical Therapy Treatment  Patient Details  Name: Donald Harvey MRN: [id number] Date of Birth: 1991-07-16 Referring Provider: Dr. Naaman Plummer  Encounter Date: 04/09/2016      PT End of Session - 04/09/16 1638    Visit Number 10   Number of Visits 17   Date for PT Re-Evaluation 05/02/16   Authorization Type [employer]-30 visit limit combo   Authorization - Visit Number 9   Authorization - Number of Visits 30   PT Start Time 9735   PT Stop Time 1507   PT Time Calculation (min) 62 min      Past Medical History  Diagnosis Date  . Childhood asthma   . Embolism - blood clot   . Spinal paraplegia ([place])     No past surgical history on file.  There were no vitals filed for this visit.      Subjective Assessment - 04/09/16 1637    Subjective Pt presents for manual wheelchair evaluation   Pertinent History Hx of T6 SCI infarction   Patient Stated Goals To walk more   Currently in Pain? No/denies            Mobility/Seating Evaluation    PATIENT INFORMATION: Name: Donald Harvey DOB: 1991-02-10  Sex: M Date seen: 04-09-16 Time: 2:00  Address:  [address] [place], [place] [address] Physician: Dr. Alger Simons This evaluation/justification form will serve as the LMN for the following suppliers: __________________________ Supplier: [employer] Contact Person: Felton Clinton, ATP Phone:  [phone]   Seating Therapist: Guido Sander, PT Phone:   [phone]   Phone: [phone]    Spouse/Parent/Caregiver name: Donald Harvey  Phone number: [phone] Insurance/Payer: Lorella Nimrod     Reason for Referral: manual wheelchair evaluation  Patient/Caregiver Goals: obtain new manual wheelchair  Patient was seen for face-to-face evaluation for new manual wheelchair.  Also present was [employer], ATP to discuss  recommendations and wheelchair options.  Further paperwork was completed and sent to vendor.  Patient appears to qualify for manual mobility device at this time per objective findings.   MEDICAL HISTORY: Diagnosis: Primary Diagnosis: Spinal infarct - T6 Onset: 07-09-2011 Diagnosis: ?????   _0 Progressive Disease Relevant past and future surgeries: None   Height: 6'2" Weight: 220# Explain recent changes or trends in weight: ?????   History including Falls: None    HOME ENVIRONMENT: _1 House  _2 Condo/town home  _3 Apartment  _4 Assisted Living    _5 Lives Alone _6  Lives with Others                                                                                          Hours with caregiver: ?????  _7 Home is accessible to patient           Stairs      _8 Yes _9  No     Ramp _10 Yes _11 No Comments:  ?????   COMMUNITY ADL: TRANSPORTATION: _12 Car    _13 Lucianne Lei    _14   Public Transportation    _0 Adapted w/c Lift    _1 Ambulance    _2 Other:       _3 Sits in wheelchair during transport  Employment/School: ????? Specific requirements pertaining to mobility ?????  Other: ?????    FUNCTIONAL/SENSORY PROCESSING SKILLS:  Handedness:   _4 Right     _5 Left    _6 NA  Comments:  ?????  Functional Processing Skills for Wheeled Mobility _7 Processing Skills are adequate for safe wheelchair operation  Areas of concern than may interfere with safe operation of wheelchair Description of problem   _8  Attention to environment      _9 Judgment      _10  Hearing  _11  Vision or visual processing      _12 Motor Planning  _13  Fluctuations in Behavior  ?????    VERBAL COMMUNICATION: _14 WFL receptive _15  WFL expressive _16 Understandable  _17 Difficult to understand  _18 non-communicative _19  Uses an augmented communication device  CURRENT SEATING / MOBILITY: Current Mobility Base:  _20 None _21 Dependent _22 Manual _23 Scooter _24 Power  Type of Control: ?????  Manufacturer:  Quickie rigid frameSize:  17x4Age: 25 years old  Current Condition  of Mobility Base:  Disrepair   Current Wheelchair components:  ?????  Describe posture in present seating system:  ?????      SENSATION and SKIN ISSUES: Sensation _25 Intact  _26 Impaired _27 Absent  Level of sensation: decreased distal to T6 dermatome - pt reports it is "spotty" in areas Pressure Relief: Able to perform effective pressure relief :    _28 Yes  _29  No Method: ???? If not, Why?: ?????  Skin Issues/Skin Integrity Current Skin Issues  _30 Yes _31 No _32 Intact _33  Red area_34  Open Area  _35 Scar Tissue _36 At risk from prolonged sitting Where  ?????  History of Skin Issues  _37 Yes _38 No Where  ????? When  ?????  Hx of skin flap surgeries  _39 Yes _40 No Where  ????? When  ?????  Limited sitting tolerance _41 Yes _42 No Hours spent sitting in wheelchair daily: 6+  Complaint of Pain:  Please describe: None   Swelling/Edema: None   ADL STATUS (in reference to wheelchair use):  Indep Assist Unable Indep with Equip Not assessed Comments  Dressing ????? ????? ????? X ????? performs from wheelchair  Eating X ????? ????? ????? ????? ?????  Toileting X ????? ????? ????? ????? ?????  Bathing X ????? ????? ????? ????? ?????  Grooming/Hygiene ????? ????? ????? X ????? performs some activities in seated position  Meal Prep ????? ????? ????? X ????? performs some from wheelchair  IADLS ????? ????? ????? X ????? ?????  Bowel Management: _43 Continent  _44 Incontinent  _45 Accidents Comments:  ?????  Bladder Management: _46 Continent  _47 Incontinent  _48 Accidents Comments:  ?????     WHEELCHAIR SKILLS: Manual w/c Propulsion: _49 UE or LE strength and endurance sufficient to participate in ADLs using manual wheelchair Arm : _50 left _51 right   _52 Both      Distance: ????? Foot:  _53 left _54 right   _55 Both  Operate Scooter: _56  Strength, hand grip, balance and transfer appropriate for use _57 Living environment is accessible for use of scooter  Operate Power w/c:  _58  Std. Joystick   _59  Alternative Controls  Indep _60  Assist _61  Dependent/unable _62  N/A _63   _64 Safe          _65  Functional      Distance: ?????  Bed confined without wheelchair _66  Yes _67  No   STRENGTH/RANGE OF MOTION:  Active Range of Motion Strength  Shoulder bil. UE's WNL's for AROM bil. UE's 5/5  Elbow WNL's bil. UE's 5/5  Wrist/Hand WNL's bil.  UE's 5/5  Hip RLE WNL's:  LLE approx. 25% hip flexion and abduction due to weakness RLE 5/5:  L hip musc. 2+/5  Knee RLE WNL's:  L knee extension WFL's with minimal to no active L hamstring  RLE 5/5:  L quads 3+/5:  L hamstrings 2-/5  Ankle RLE WNL's:  LLE no AROM RLE 5/5:  L ankle musc. 0/5     MOBILITY/BALANCE:  _0  Patient is totally dependent for mobility  ?????    Balance Transfers Ambulation  Sitting Balance: Standing Balance: _1  Independent _2  Independent/Modified Independent  _3  WFL     _4  WFL _5  Supervision _6  Supervision  _7  Uses UE for balance  _8  Supervision _9  Min Assist _10  Ambulates with Assist  ?????    _11  Min Assist _12  Min assist _13  Mod Assist _14  Ambulates with Device:      _15  RW  _16  StW  _17  Cane  _18  ?????  _19  Mod Assist _20  Mod assist _21  Max assist   _22  Max Assist _23  Max assist _24  Dependent _25  Indep. Short Distance Only  _26  Unable _27  Unable _28  Lift / Sling Required Distance (in feet)  ?????   _29  Sliding board _30  Unable to Ambulate (see explanation below)  Cardio Status:  _31 Intact  _32  Impaired   _33  NA     ?????  Respiratory Status:  _34 Intact   _35 Impaired   _36 NA     ?????  Orthotics/Prosthetics: KAFO for LLE  Comments (Address manual vs power w/c vs scooter): ?????         Anterior / Posterior Obliquity Rotation-Pelvis ?????  PELVIS    _37  _38  _39   Neutral Posterior Anterior  _40  _41  _42   WFL Rt elev Lt elev  _43  _44  _45   WFL Right Left                      Anterior    Anterior     _46  Fixed _47  Other _48  Partly Flexible _49  Flexible   _50  Fixed _51  Other _52  Partly Flexible  _53  Flexible  _54  Fixed _55  Other _56  Partly Flexible  _57  Flexible   TRUNK   _58  _59  _60   WFL ? Thoracic ? Lumbar  Kyphosis Lordosis  _61  _62  _63   WFL Convex Convex  Right Left _64 c-curve _65 s-curve _66 multiple  _67  Neutral _68  Left-anterior _69  Right-anterior     _70  Fixed _71  Flexible _72  Partly Flexible _73  Other  _74  Fixed _75  Flexible _76  Partly Flexible _77  Other  _78  Fixed             _79  Flexible _80  Partly Flexible _81  Other    Position Windswept  ?????  HIPS          _82            _83               _84    Neutral       Abduct        ADduct         _85           _86            _87   Neutral Right           Left      _88  Fixed _89  Subluxed _90  Partly Flexible _91  Dislocated _92  Flexible  _93  Fixed _94  Other _95  Partly Flexible  _96  Flexible                 Foot Positioning  Knee Positioning  ?????    _0  WFL  _1 Lt _2 Rt _3  WFL  _4 Lt _5 Rt    KNEES ROM concerns: ROM concerns:    & Dorsi-Flexed _6 Lt _7 Rt ?????    FEET Plantar Flexed _8 Lt _9 Rt      Inversion                 _10 Lt _11 Rt      Eversion                 _12 Lt _13 Rt     HEAD _14  Functional _15  Good Head Control  ?????  & _16  Flexed         _17  Extended _18  Adequate Head Control    NECK _19  Rotated  Lt  _20  Lat Flexed Lt _21  Rotated  Rt _22  Lat Flexed Rt _23  Limited Head Control     _24  Cervical Hyperextension _25  Absent  Head Control     SHOULDERS ELBOWS WRIST& HAND ?????      Left     Right    Left     Right    Left     Right   U/E _26 Functional           _27 Functional WNL WNL _28 Fisting             _29 Fisting      _30 elev   _31 dep      _32 elev   _33 dep       _34 pro -_35 retract     _36 pro  _37 retract _38 subluxed             _39 subluxed           Goals for Wheelchair Mobility  _40  Independence with mobility in the home with motor related ADLs (MRADLs)  _41  Independence with MRADLs in the community _42  Provide dependent mobility  _43  Provide recline     _44 Provide tilt   Goals for Seating system _45  Optimize pressure distribution _46  Provide support needed to facilitate function or safety _47  Provide corrective forces to  assist with maintaining or improving posture _48  Accommodate client's posture:   current seated postures and positions are not flexible or will not tolerate corrective forces _49  Client to be independent with relieving pressure in the wheelchair _50 Enhance physiological function such as breathing, swallowing, digestion  Simulation ideas/Equipment trials:????? State why other equipment was unsuccessful:?????   MOBILITY BASE RECOMMENDATIONS and JUSTIFICATION: MOBILITY COMPONENT JUSTIFICATION  Manufacturer: Ki Model: Rogue   Size: Width 17Seat Depth 18 _51 provide transport from point A to B      _52 promote Indep mobility  _53 is not a safe, functional ambulator _54 walker or cane inadequate _55 non-standard width/depth necessary to accommodate anatomical measurement _56  ?????  _57 Manual Mobility Base _58 non-functional ambulator    _59 Scooter/POV  _60 can safely operate  _61 can safely transfer   _62 has adequate trunk stability  _63 cannot functionally propel manual w/c  _64 Power Mobility Base  _65 non-ambulatory  _66 cannot functionally propel manual wheelchair  _67  cannot functionally and safely operate scooter/POV _68 can safely operate and willing to  _69 Stroller Base _70 infant/child  _71 unable to propel manual wheelchair _72 allows for growth _73 non-functional ambulator _74 non-functional UE _75 Indep mobility is not a goal at this time  _76 Tilt  _77 Forward _78 Backward _79 Powered tilt  _80 Manual tilt  _81 change position against gravitational force on head and shoulders  _82 change position for pressure relief/cannot weight shift _83 transfers  _84 management of tone _85 rest periods _86 control edema _87 facilitate postural control  _88  ?????  _89 Recline  _90 Power recline on power base _91 Manual recline on manual base  _92 accommodate femur  to back angle  _0 bring to full recline for ADL care  _1 change position for pressure relief/cannot weight shift _2 rest periods _3 repositioning for transfers or clothing/diaper /catheter  changes _4 head positioning  _5 Lighter weight required _6 self- propulsion  _7 lifting _8  ?????  _9 Heavy Duty required _10 user weight greater than 250# _11 extreme tone/ over active movement _12 broken frame on previous chair _13  ?????  _14  Back  _15  Angle Adjustable _16  Custom molded Tension adjustable _17 postural control _18 control of tone/spasticity _19 accommodation of range of motion _20 UE functional control _21 accommodation for seating system _22  ????? _23 provide lateral trunk support _24 accommodate deformity _25 provide posterior trunk support _26 provide lumbar/sacral support _27 support trunk in midline _28 Pressure relief over spinal processes  _29  Seat Cushion M2 by [place] _30 impaired sensation  _31 decubitus ulcers present _32 history of pressure ulceration _33 prevent pelvic extension _34 low maintenance  _35 stabilize pelvis  _36 accommodate obliquity _37 accommodate multiple deformity _38 neutralize lower extremity position _39 increase pressure distribution _40  ?????  _41  Pelvic/thigh support  _42  Lateral thigh guide _43  Distal medial pad  _44  Distal lateral pad _45  pelvis in neutral _46 accommodate pelvis _47  position upper legs _48  alignment _49  accommodate ROM _50  decr adduction _51 accommodate tone _52 removable for transfers _53 decr abduction  _54  Lateral trunk Supports _55  Lt     _56  Rt _57 decrease lateral trunk leaning _58 control tone _59 contour for increased contact _60 safety  _61 accommodate asymmetry _62  ?????  _63  Mounting hardware  _64 lateral trunk supports  _65 back   _66 seat _67 headrest      _68  thigh support _69 fixed   _70 swing away _71 attach seat platform/cushion to w/c frame _72 attach back cushion to w/c frame _73 mount postural supports _74 mount headrest  _75 swing medial thigh support away _76 swing lateral supports away for transfers  _77  ?????    Armrests  _78 fixed _79 adjustable height _80 removable   _81 swing away  _82 flip back   _83 reclining _84 full length pads _85 desk    _86 pads tubular   _87 provide support with elbow at 90   _88 provide support for w/c tray _89 change of height/angles for variable activities _90 remove for transfers _91 allow to come closer to table top _92 remove for access to tables _93  ?????  Hangers/ Leg rests  _94 60 _95 70 _96 90 _97 elevating _98 heavy duty  _99 articulating _100 fixed _101 lift off _102 swing away     _103 power _104 provide LE support  _105 accommodate to hamstring tightness _106 elevate legs during recline   _107 provide change in position for Legs _108 Maintain placement of feet on footplate _109 durability _110 enable transfers _111 decrease edema _112 Accommodate lower leg length _113  ?????  Foot support Footplate    <ZOXWRUEAVWUJWJXB>_1<\/YNWGNFAOZHYQMVHQ>_469 Lt  _115  Rt  _116  Center mount _117 flip up     _118 depth/angle adjustable _119 Amputee adapter    _120  Lt     _121  Rt _122 provide foot support _123 accommodate to ankle ROM _124 transfers _125 Provide support for residual extremity _126  allow foot to go under wheelchair base _127  decrease tone  _128  ?????  _129  Ankle strap/heel loops _130 support foot on foot support _131 decrease extraneous movement _132 provide input to heel  _133 protect foot  Tires: _134 pneumatic  _135 flat free inserts  _136 solid  _137 decrease maintenance  _138 prevent frequent flats _139 increase shock absorbency _140 decrease pain from road shock _141 decrease spasms from road shock _142  ?????  _143  Headrest  _144 provide posterior head support _145 provide posterior neck support _146 provide lateral head support _147 provide anterior head support _148 support during tilt and recline _149 improve feeding   _150 improve respiration _151 placement of switches _152 safety  _153 accommodate ROM  _154 accommodate tone _155 improve visual orientation  _156  Anterior chest strap _157  Vest _158  Shoulder retractors  _159 decrease forward movement of shoulder _160 accommodation of TLSO _161 decrease forward movement of trunk _162 decrease shoulder elevation _163 added abdominal  support _0 alignment _1 assistance with shoulder control  _2  ?????  Pelvic Positioner _3 Belt _4 SubASIS bar _5 Dual Pull  _6 stabilize tone _7 decrease falling out of chair/ **will not Decr potential for sliding due to pelvic tilting _8 prevent excessive rotation _9 pad for protection over boney prominence _10 prominence comfort _11 special pull angle to control rotation _12  ?????  Upper Extremity Support _13 L   _14  R _15 Arm trough    _16 hand support _17  tray       _18 full tray _19 swivel mount _20 decrease edema      _21 decrease subluxation   _22 control tone   _23 placement for AAC/Computer/EADL _24 decrease gravitational pull on shoulders _25 provide midline positioning _26 provide support to increase UE function _27 provide hand support in natural position _28 provide work surface   POWER WHEELCHAIR CONTROLS  _29 Proportional  _30 Non-Proportional Type ????? _31 Left  _32 Right _33 provides access for controlling wheelchair   _34 lacks motor control to operate proportional drive control <ZJIRCVELFYBOFBPZ>_0<\/CHENIDPOEUMPNTIR>_44 unable to understand proportional controls  Actuator Control Module  _36 Single  _37 Multiple   _38 Allow the client to operate the power seat function(s) through the joystick control   _39 Safety Reset Switches _40 Used to change modes and stop the wheelchair when driving in latch mode    _41 Upgraded Electronics   _42 programming for accurate control _43 progressive Disease/changing condition _44 non-proportional drive control needed _45 Needed in order to operate power seat functions through joystick control   _46 Display box _47 Allows user to see in which mode and drive the wheelchair is set  _48 necessary for alternate controls    _49 Digital interface electronics _50 Allows w/c to operate when using alternative drive controls  <RXVQMGQQPYPPJKDT>_2<\/IZTIWPYKDXIPJASN>_05 ASL Head Array _52 Allows client to operate wheelchair  through switches placed in tri-panel headrest  _53 Sip and puff with tubing kit _54 needed to operate sip and puff drive controls  <LZJQBHALPFXTKWIO>_9<\/BDZHGDJMEQASTMHD>_62 Upgraded tracking electronics _56 increase safety when driving <IWLNLGXQJJHERDEY>_8<\/XKGYJEHUDJSHFWYO>_37 correct tracking when on uneven surfaces  _58 [facility] for switches or joystick _59 Attaches switches to w/c   _60 Swing away for access or transfers _61 midline for optimal placement _62 provides for consistent access  _63 Attendant controlled joystick plus mount _64 safety _65 long distance driving <CHYIFOYDXAJOINOM>_7<\/EHMCNOBSJGGEZMOQ>_94 operation of seat functions _67 compliance with transportation regulations _68  ?????    Rear wheel placement/Axle adjustability _69 None _70 semi adjustable _71 fully adjustable  _72 improved UE access to wheels _73 improved stability _74 changing angle in space for improvement of postural stability _75 1-arm drive access <TMLYYTKPTWSFKCLE>_7<\/NTZGYFVCBSWHQPRF>_16 amputee pad placement _77  ?????  Wheel rims/ hand rims  _78 metal  _79 plastic coated _80 oblique projections _81 vertical projections _82 Provide ability to propel manual wheelchair  _83  Increase self-propulsion with hand weakness/decreased grasp  Push handles _84 extended  _85 angle adjustable  _86 standard _87 caregiver access _88 caregiver assist _89 allows "hooking" to enable increased ability to perform ADLs or maintain balance  One armed device  _90 Lt   _91 Rt _92 enable propulsion of manual wheelchair with one arm   _93  ?????   Brake/wheel lock extension _94  Lt   _95  Rt _96 increase indep in applying wheel locks   _97 Side guards _98 prevent clothing getting caught in wheel or becoming soiled _99  prevent skin tears/abrasions  Battery: ????? _100 to power wheelchair ?????  Other: Calf strap for proper positioning of feet on footplate ?????  The above equipment has a life- long use expectancy. Growth and changes in medical and/or functional conditions would be the exceptions. This is to certify that the therapist has no financial relationship with durable medical provider or manufacturer. The therapist will not receive remuneration of any kind for the equipment recommended in this evaluation.   Patient has mobility limitation that significantly impairs safe, timely participation in one or more mobility related ADL's.  (bathing, toileting, feeding, dressing, grooming, moving  from room to room)                                                              _0  Yes _1  No Will mobility device sufficiently improve ability to participate and/or be aided in participation of MRADL's?         _2  Yes _3  No Can limitation be compensated for with use of a cane or walker?                                                                                _4  Yes _5  No Does patient or caregiver demonstrate ability/potential ability & willingness to safely use the mobility device?   _6  Yes _7  No Does patient's home environment support use of recommended mobility device?                                                    _8  Yes _9  No Does patient have sufficient upper extremity function necessary to functionally propel a manual wheelchair?    _10  Yes _11  No Does patient have sufficient strength and trunk stability to safely operate a POV (scooter)?                                  _12  Yes _13  No Does patient need additional features/benefits provided by a power wheelchair for MRADL's in the home?       _14  Yes _15  No Does the patient demonstrate the ability to safely use a power wheelchair?                                                              _16  Yes _17  No  Therapist Name Printed: Guido Sander, PT Date: 04-09-16  Therapist's Signature:   Date:   Supplier's Name Printed: Felton Clinton, [employer] Date: 04-09-16  Supplier's Signature:   Date:  Patient/Caregiver Signature:   Date:     This is to certify that I have read this evaluation and do agree with the content within:    Physician's Name Printed: Alger Simons, MD  Physician's Signature:  Date:                              PT Short Term Goals - 04/02/16 1517    PT SHORT TERM GOAL #1   Title Pt will be IND in HEP to improve balance and strength. Target date: 03/31/16   Status Achieved   PT SHORT TERM GOAL #2   Title Pt will  improve gait speed to >/=1.19f/sec. with LRAD to decr. falls risk. Target date: 03/31/16   Status Partially Met   PT SHORT TERM GOAL #3   Title  Perform BERG and write goals. Target date: 03/31/16   Status Achieved   PT SHORT TERM GOAL #4   Title Pt will amb. 300' over even terrain with LRAD and AFO to improve functional mobility. Target date: 03/31/16   Status Deferred   PT SHORT TERM GOAL #5   Title Pt will traverse 12 steps with one handrail and AFO at MOD I level to negotiate steps at home. Target date: 03/31/16   Status Partially Met           PT Long Term Goals - 03/12/16 1555    PT LONG TERM GOAL #1   Title Pt will amb. 500' over paved and even surfaces with SPC and AFO at MOD I level in order to improve functional mobilty.Target date:04/28/16   Status On-going   PT LONG TERM GOAL #2   Title Pt will improve gait speed to >/=2.641fsec with LRAD and AFO to amb. safely in the community. Target date: 04/28/16   Status On-going   PT LONG TERM GOAL #3   Title Pt will amb. 5061with AFO and min guard and no AD to amb. safely at home. Target date: 04/28/16   Status On-going   PT LONG TERM GOAL #4   Title Pt will verbalize plans to join gym and/or play w/c basketball to maintain gains made in PT and improve quality of life. Target date: 04/28/16   Status On-going   PT LONG TERM GOAL #5   Title improve BERG balance score to > 45/56 for decreased fall risk. (04/28/16)   Status On-going               Plan - 04/09/16 1639    Clinical Impression Statement Pt evaluated for manual wheelchair (Ki Rogue recommended) with vendor Josh Cadle, [employer] [facility] recommended   Clinical Impairments Affecting Rehab Potential Hx of SCI infarct   PT Frequency 2x / week   PT Duration 8 weeks   PT Treatment/Interventions ADLs/Self Care Home Management;Biofeedback;Balance training;Manual techniques;Therapeutic exercise;Therapeutic activities;Orthotic Fit/Training;Functional mobility training;Stair training;Gait training;Wheelchair mobility training;Patient/family education;DME Instruction;Neuromuscular re-education    PT Next Visit Plan continue gait with FA crutches, break gait down in // bars   Consulted and Agree with Plan of Care Patient      Patient will benefit from skilled therapeutic intervention in order to improve the following deficits and impairments:  Abnormal gait, Decreased endurance, Impaired flexibility, Decreased coordination, Decreased balance, Decreased mobility, Decreased skin integrity, Decreased knowledge of use of DME, Decreased strength, Impaired tone  Visit Diagnosis: Other abnormalities of gait and mobility  Spastic hemiplegia affecting left dominant side ([facility]    Problem List Patient Active Problem List   Diagnosis Date Noted  . Spastic paraparesis (HCSchererville07/30/2014  . Spinal cord infarction (HCStartex07/30/2014  . Food allergy 06/19/2012  . Paraplegic spinal paralysis (HCKirbyville11/20/2012  . Hypercoagulable state (HCAngola11/20/2012  . Neurogenic bladder 09/22/2011  . Neurogenic bowel 09/22/2011    DiAlda LeaPT 04/09/2016, 4:46 PM  CoDeweyville[address]u[place]hone: [phone] Fax:  [phone]: JeBRODY BONNEAURN: [id number] of Birth: [birth year]

## 2016-04-13 ENCOUNTER — Ambulatory Visit: Payer: BLUE CROSS/BLUE SHIELD | Admitting: Rehabilitation

## 2016-04-13 DIAGNOSIS — R2689 Other abnormalities of gait and mobility: Secondary | ICD-10-CM

## 2016-04-13 DIAGNOSIS — G8112 Spastic hemiplegia affecting left dominant side: Secondary | ICD-10-CM

## 2016-04-13 NOTE — Therapy (Signed)
[facility] [address] [place], [place], [address] Phone: [phone]   Fax:  [phone]  Physical Therapy Treatment  Patient Details  Name: Donald Harvey MRN: [id number] Date of Birth: 08-10-1991 Referring Provider: Dr. Naaman Plummer  Encounter Date: 04/13/2016      PT End of Session - 04/13/16 1327    Visit Number 11   Number of Visits 17   Date for PT Re-Evaluation 05/02/16   Authorization Type BCBS-30 visit limit combo   Authorization - Visit Number 11  note error in visits made on 5/30   Authorization - Number of Visits 30   PT Start Time 1321   PT Stop Time 1410   PT Time Calculation (min) 49 min   Activity Tolerance Patient tolerated treatment well   Behavior During Therapy [facility] for tasks assessed/performed      Past Medical History  Diagnosis Date  . Childhood asthma   . Embolism - blood clot   . Spinal paraplegia ([place])     No past surgical history on file.  There were no vitals filed for this visit.      Subjective Assessment - 04/13/16 1327    Subjective No changes since last visit.    Pertinent History Hx of T6 SCI infarction   Patient Stated Goals To walk more   Currently in Pain? No/denies            Self Care:  Had lengthy discussion with pt regarding having [employer] come to PT session in order to address bracing needs/modifications in order to improve wearing tolerance and improved alignment for increased tolerance to gait.  Also discussed getting static progressive cast for L ankle in order to increase ankle ROM and alignment for improved alignment during all phases of gait.  Pt in agreement and verbalized understanding of discussion.  PT to call [employer] following session.    Gait:  Performed gait x 345' (in three bouts) with RW and KAFO in order to better determine if gait compensations leading to increased tone, improving gait mechanics and postural control.  Provided light  facilitation at pelvis in order to increase hip extension/protraction over B LE in stance phase of gait as well as at trunk for upright posture throughout, but again, esp at stance phase of gait.  Initially noted exaggerated L toe drag, therefore pt willing to have surgical shoe cover placed for gait trial.  Note marked improvement in ability to clear LLE during swing and feel that this decreases compensations at hip and decreases tone somewhat as well.  Pt not willing to discuss toe cap for shoe as he does not want to "mess up" his shoes and note that he wears many different pairs of shoes depending on outfit.                       PT Education - 04/13/16 1327    Education provided Yes   Education Details educated on PT contacting [employer] to have them attend session for brace modifications and possible need for static progressive brace.     Person(s) Educated Patient   Methods Explanation   Comprehension Verbalized understanding          PT Short Term Goals - 04/02/16 1517    PT SHORT TERM GOAL #1   Title Pt will be IND in HEP to improve balance and strength. Target date: 03/31/16   Status Achieved   PT SHORT TERM GOAL #2  Title Pt will improve gait speed to >/=1.49f/sec. with LRAD to decr. falls risk. Target date: 03/31/16   Status Partially Met   PT SHORT TERM GOAL #3   Title Perform BERG and write goals. Target date: 03/31/16   Status Achieved   PT SHORT TERM GOAL #4   Title Pt will amb. 300' over even terrain with LRAD and AFO to improve functional mobility. Target date: 03/31/16   Status Deferred   PT SHORT TERM GOAL #5   Title Pt will traverse 12 steps with one handrail and AFO at MOD I level to negotiate steps at home. Target date: 03/31/16   Status Partially Met           PT Long Term Goals - 03/12/16 1555    PT LONG TERM GOAL #1   Title Pt will amb. 500' over paved and even surfaces with SPC and AFO at MOD I level in order to improve functional  mobilty.Target date:04/28/16   Status On-going   PT LONG TERM GOAL #2   Title Pt will improve gait speed to >/=2.671fsec with LRAD and AFO to amb. safely in the community. Target date: 04/28/16   Status On-going   PT LONG TERM GOAL #3   Title Pt will amb. 5028with AFO and min guard and no AD to amb. safely at home. Target date: 04/28/16   Status On-going   PT LONG TERM GOAL #4   Title Pt will verbalize plans to join gym and/or play w/c basketball to maintain gains made in PT and improve quality of life. Target date: 04/28/16   Status On-going   PT LONG TERM GOAL #5   Title improve BERG balance score to > 45/56 for decreased fall risk. (04/28/16)   Status On-going               Plan - 04/13/16 1434    Clinical Impression Statement Skilled session focused on gait with use of RW with KAFO in order to break down phases of gait to better control hip/pelvis, decrease tone and improve biomechanics.  Discussed having [employer] come to session in order to discuss bracing options and possible need for static progressive bracing due to fixed nature of L ankle.     Clinical Impairments Affecting Rehab Potential Hx of SCI infarct   PT Frequency 2x / week   PT Duration 8 weeks   PT Treatment/Interventions ADLs/Self Care Home Management;Biofeedback;Balance training;Manual techniques;Therapeutic exercise;Therapeutic activities;Orthotic Fit/Training;Functional mobility training;Stair training;Gait training;Wheelchair mobility training;Patient/family education;DME Instruction;Neuromuscular re-education   PT Next Visit Plan continue gait with FA crutches, L knee control (in standing if can get L ankle in good alignment)   Consulted and Agree with Plan of Care Patient      Patient will benefit from skilled therapeutic intervention in order to improve the following deficits and impairments:  Abnormal gait, Decreased endurance, Impaired flexibility, Decreased coordination, Decreased balance, Decreased  mobility, Decreased skin integrity, Decreased knowledge of use of DME, Decreased strength, Impaired tone  Visit Diagnosis: Other abnormalities of gait and mobility  Spastic hemiplegia affecting left dominant side ([facility]    Problem List Patient Active Problem List   Diagnosis Date Noted  . Spastic paraparesis (HCLondon07/30/2014  . Spinal cord infarction (HCPierpoint07/30/2014  . Food allergy 06/19/2012  . Paraplegic spinal paralysis (HCReadlyn11/20/2012  . Hypercoagulable state (HCLapeer11/20/2012  . Neurogenic bladder 09/22/2011  . Neurogenic bowel 09/22/2011    EmCameron SprangPT, MPT CoSallisaw[address]  [place], [place], [address] Phone: [phone]   Fax:  [phone] 04/13/2016, 2:39 PM  Name: ZACHREY DEUTSCHER MRN: [id number] Date of Birth: 12/23/1990

## 2016-04-16 ENCOUNTER — Ambulatory Visit: Payer: BLUE CROSS/BLUE SHIELD | Admitting: Rehabilitation

## 2016-04-20 ENCOUNTER — Telehealth: Payer: Self-pay | Admitting: Rehabilitation

## 2016-04-20 ENCOUNTER — Ambulatory Visit: Payer: BLUE CROSS/BLUE SHIELD | Admitting: Rehabilitation

## 2016-04-20 DIAGNOSIS — IMO0002 Reserved for concepts with insufficient information to code with codable children: Secondary | ICD-10-CM

## 2016-04-20 DIAGNOSIS — R2689 Other abnormalities of gait and mobility: Secondary | ICD-10-CM | POA: Diagnosis not present

## 2016-04-20 DIAGNOSIS — G9511 Acute infarction of spinal cord (embolic) (nonembolic): Secondary | ICD-10-CM

## 2016-04-20 DIAGNOSIS — G8112 Spastic hemiplegia affecting left dominant side: Secondary | ICD-10-CM

## 2016-04-20 NOTE — Telephone Encounter (Signed)
Dr. Anola GurneySwartz/Jason Nicoletta,   Hi, I have been working with Elson ClanJeremy Wirsing in OP neuro PT and we are going to get him a new AFO for LLE (order has been written so we are all good for this) but we also feel that he would benefit from a static progressive splint due to his L ankle being nearly fixed in plantar flexed position and due to increased supination tone during functional gait.  Chris from [employer] has been present during session to discuss with pt and states that they are usually covered via insurance, but will need an MD order stating need for this.  If you have any questions, feel free to contact me.  If you write order if Epic, I can give to [facility] when I see him or the patient is coming to see you Wednesday I believe so you could give it to him as well.  Whatever is easiest for you guys.    Thanks so much! Harriet ButteEmily Aasiyah Auerbach, PT, MPT [facility] [address] [place], [place], [address] Phone: [phone]   Fax:  [phone] 04/20/2016, 3:23 PM

## 2016-04-20 NOTE — Telephone Encounter (Signed)
Order is in epic. It can be printed out and given to [employer]. Thanks!!!

## 2016-04-20 NOTE — Therapy (Signed)
[facility] [address] [place], [place], [address] Phone: [phone]   Fax:  [phone]  Physical Therapy Treatment  Patient Details  Name: Donald Harvey MRN: [id number] Date of Birth: [birth year] Referring Provider: Dr. Riley Kill  Encounter Date: 04/20/2016      PT End of Session - 04/20/16 1511    Visit Number 12   Number of Visits 17   Date for PT Re-Evaluation 05/02/16   Authorization Type BCBS-30 visit limit combo   Authorization - Visit Number 12  note error in visits made on 5/30   Authorization - Number of Visits 30   PT Start Time 1405   PT Stop Time 1449   PT Time Calculation (min) 44 min   Activity Tolerance Patient tolerated treatment well   Behavior During Therapy [facility] for tasks assessed/performed      Past Medical History  Diagnosis Date  . Childhood asthma   . Embolism - blood clot   . Spinal paraplegia (HCC)     No past surgical history on file.  There were no vitals filed for this visit.      Subjective Assessment - 04/20/16 1510    Subjective Pt reports he did not do a lot over the past week due to illness.     Pertinent History Hx of T6 SCI infarction   Patient Stated Goals To walk more   Currently in Pain? No/denies             Orthotic Fit Train:  Skilled session focused on formal assessment with Thayer Ohm from [place] in order to determine appropriate bracing for pt.  Had pt ambulate with brace with RW and with single FA crutch to better assess deficits and needs from future brace.  Discussed with Thayer Ohm and pt getting a custom solid back AFO with heel wedge built into brace.  Would also modify as needed when ankle ROM improves and gets closer to neutral.  Pt given info on how to make appt with [employer], order provided and pt educated to hold therapy until braces arrive.  Pt verbalized understanding.                      PT Education - 04/20/16 1510    Education  provided Yes   Education Details Thayer Ohm from [facility] present during session to discuss new bracing, PT discussed holding therapy until he gets new brace (s).     Person(s) Educated Patient   Methods Explanation   Comprehension Verbalized understanding          PT Short Term Goals - 04/02/16 1517    PT SHORT TERM GOAL #1   Title Pt will be IND in HEP to improve balance and strength. Target date: 03/31/16   Status Achieved   PT SHORT TERM GOAL #2   Title Pt will improve gait speed to >/=1.86ft/sec. with LRAD to decr. falls risk. Target date: 03/31/16   Status Partially Met   PT SHORT TERM GOAL #3   Title Perform BERG and write goals. Target date: 03/31/16   Status Achieved   PT SHORT TERM GOAL #4   Title Pt will amb. 300' over even terrain with LRAD and AFO to improve functional mobility. Target date: 03/31/16   Status Deferred   PT SHORT TERM GOAL #5   Title Pt will traverse 12 steps with one handrail and AFO at MOD I level to negotiate steps at home. Target date: 03/31/16   Status  Partially Met           PT Long Term Goals - 03/12/16 1555    PT LONG TERM GOAL #1   Title Pt will amb. 500' over paved and even surfaces with SPC and AFO at MOD I level in order to improve functional mobilty.Target date:04/28/16   Status On-going   PT LONG TERM GOAL #2   Title Pt will improve gait speed to >/=2.26f/sec with LRAD and AFO to amb. safely in the community. Target date: 04/28/16   Status On-going   PT LONG TERM GOAL #3   Title Pt will amb. 56 with AFO and min guard and no AD to amb. safely at home. Target date: 04/28/16   Status On-going   PT LONG TERM GOAL #4   Title Pt will verbalize plans to join gym and/or play w/c basketball to maintain gains made in PT and improve quality of life. Target date: 04/28/16   Status On-going   PT LONG TERM GOAL #5   Title improve BERG balance score to > 45/56 for decreased fall risk. (04/28/16)   Status On-going               Plan - 04/20/16 1514     Clinical Impression Statement Skilled session focused on assessment of brace with CGerald Stabsfrom [place]  Also discussed static progressive splint in order to increase ROM in ankle.  Pt verbalized understanding and is to hold therapy until he gets new braces to continue working on gait and LLE strengthening/NMR.     Rehab Potential Good   Clinical Impairments Affecting Rehab Potential Hx of SCI infarct   PT Frequency 2x / week   PT Duration 8 weeks   PT Treatment/Interventions ADLs/Self Care Home Management;Biofeedback;Balance training;Manual techniques;Therapeutic exercise;Therapeutic activities;Orthotic Fit/Training;Functional mobility training;Stair training;Gait training;Wheelchair mobility training;Patient/family education;DME Instruction;Neuromuscular re-education   PT Next Visit Plan Holding therapy as of 04/20/16 in order for pt to get new brace (s) from [employer].     Consulted and Agree with Plan of Care Patient      Patient will benefit from skilled therapeutic intervention in order to improve the following deficits and impairments:     Visit Diagnosis: Other abnormalities of gait and mobility  Spastic hemiplegia affecting left dominant side ([facility]     Problem List Patient Active Problem List   Diagnosis Date Noted  . Spastic paraparesis (HHigh[place] 05/31/2013  . Spinal cord infarction (HDaw[place] 05/31/2013  . Food allergy 06/19/2012  . Paraplegic spinal paralysis (HAshley 09/22/2011  . Hypercoagulable state (HCrooked Creek 09/22/2011  . Neurogenic bladder 09/22/2011  . Neurogenic bowel 09/22/2011    ECameron Sprang PT, MPT [facility] [address]Phone: [phone]  Fax:  [phone]/19/2017, 3:17 PM  Name: JLINZY DARLINGMRN: [id number] of Birth: 411/03/1991

## 2016-04-22 ENCOUNTER — Encounter: Payer: Self-pay | Admitting: Physical Medicine & Rehabilitation

## 2016-04-22 ENCOUNTER — Encounter
Payer: BLUE CROSS/BLUE SHIELD | Attending: Physical Medicine & Rehabilitation | Admitting: Physical Medicine & Rehabilitation

## 2016-04-22 VITALS — BP 118/75 | HR 67

## 2016-04-22 DIAGNOSIS — G9511 Acute infarction of spinal cord (embolic) (nonembolic): Secondary | ICD-10-CM

## 2016-04-22 DIAGNOSIS — G8110 Spastic hemiplegia affecting unspecified side: Secondary | ICD-10-CM | POA: Diagnosis not present

## 2016-04-22 DIAGNOSIS — IMO0002 Reserved for concepts with insufficient information to code with codable children: Secondary | ICD-10-CM

## 2016-04-22 DIAGNOSIS — G8220 Paraplegia, unspecified: Secondary | ICD-10-CM | POA: Insufficient documentation

## 2016-04-22 DIAGNOSIS — G8381 Brown-Sequard syndrome: Secondary | ICD-10-CM | POA: Insufficient documentation

## 2016-04-22 DIAGNOSIS — G8389 Other specified paralytic syndromes: Secondary | ICD-10-CM | POA: Diagnosis not present

## 2016-04-22 DIAGNOSIS — N319 Neuromuscular dysfunction of bladder, unspecified: Secondary | ICD-10-CM | POA: Diagnosis not present

## 2016-04-22 DIAGNOSIS — Z5181 Encounter for therapeutic drug level monitoring: Secondary | ICD-10-CM | POA: Diagnosis not present

## 2016-04-22 DIAGNOSIS — I749 Embolism and thrombosis of unspecified artery: Secondary | ICD-10-CM | POA: Insufficient documentation

## 2016-04-22 DIAGNOSIS — Z79899 Other long term (current) drug therapy: Secondary | ICD-10-CM

## 2016-04-22 NOTE — Progress Notes (Signed)
Subjective:    Patient ID: Donald Harvey, male    DOB: 1991-01-24, 25 y.o.   MRN: [id number]  HPI   Donald Donald Harvey is here in follow up of his SCI and spastic paraplegia. [facility] has [facility]. His left heel remains tight. i have discussed orthoses with PT and they're working on an AFO as well as a joint/rom splint.  He tried the klonopin increased and apparently it made him have tremors and feel jittery but he admitted to having a UTI at the same time, so hes not sure if the UTI was the actual problem. He wanted his bladder to clear before starting the medication.  Pain Inventory Average Pain 0 Pain Right Now 0 My pain is no pain  In the last 24 hours, has pain interfered with the following? General activity 0 Relation with others 0 Enjoyment of life 0 What TIME of day is your pain at its worst? no pain Sleep (in general) NA  Pain is worse with: no pain Pain improves with: no pain Relief from Meds: 0  Mobility use a wheelchair Do you have any goals in this area?  yes  Function not employed: date last employed . Do you have any goals in this area?  yes  Neuro/Psych No problems in this area  Prior Studies Any changes since last visit?  no  Physicians involved in your care Any changes since last visit?  no   History reviewed. No pertinent family history. Social History   Social History  . Marital Status: Single    Spouse Name: N/A  . Number of Children: N/A  . Years of Education: N/A   Social History Main Topics  . Smoking status: Never Smoker   . Smokeless tobacco: Never Used  . Alcohol Use: Yes  . Drug Use: No  . Sexual Activity: Not Asked   Other Topics Concern  . None   Social History Narrative   History reviewed. No pertinent past surgical history. Past Medical History  Diagnosis Date  . Childhood asthma   . Embolism - blood clot   . Spinal paraplegia (HCC)    BP 118/75 mmHg  Pulse 67  SpO2 98%  Opioid Risk Score:     Fall Risk Score:  `1  Depression screen PHQ 2/9  Depression screen [facility] 2/9 02/10/2016 01/22/2016  Decreased Interest 0 0  Down, Depressed, Hopeless 0 0  PHQ - 2 Score 0 0     Review of Systems  HENT: Negative.   Eyes: Negative.   Respiratory: Negative.   Cardiovascular: Negative.   Gastrointestinal: Negative.   Endocrine: Negative.   Genitourinary: Negative.   Musculoskeletal: Negative.   Skin: Negative.   Allergic/Immunologic: Negative.   Neurological: Positive for weakness and numbness.  Hematological: Negative.   Psychiatric/Behavioral: Negative.        Objective:   Physical Exam  General: Alert and oriented x 3, No apparent distress  HEENT: Head is normocephalic, atraumatic, PERRLA, EOMI, sclera anicteric, oral mucosa pink and moist, dentition intact, ext ear canals clear,  Neck: Supple without JVD or lymphadenopathy  Heart: Reg rate and rhythm. No murmurs rubs or gallops  Chest: CTA bilaterally without wheezes, rales, or rhonchi; no distress  Abdomen: Soft, non-tender, non-distended, bowel sounds positive.  Extremities: No clubbing, cyanosis, or edema. Pulses are 2+  Skin: Clean and intact without signs of breakdown  Neuro: Pt is cognitively appropriate with normal insight, memory, and awareness. Cranial nerves 2-12 are intact.  Diminished to LT and PP particularly in the right leg. He does have a lower abdominal sensory level more so ln the right. Left leg was less affected. Reflexes are 2+ in the [place]. He is 2++ in the RLE and 3++ LLE. He has minimal clonus in the left ankle, none on the right. He has 3 /4 tone along the gastroc, post-tib and tib anterior with equinovarus deformity noted once again--slightly decreased. Heel cord remains very tight--. UES is 5/5. RLE is 4 to 4+/5 with decreased FMC. LLE is 1-2 with HF, HE was 1. KE 1-2, ADF and APF essentially trace to 1.  Musculoskeletal: Full ROM, No pain with AROM or PROM in the neck, trunk, or extremities. Posture  appropriate  Psych: Pt's affect remains very flat.  OTHER: w/c is worn down. Tires are flat, brakes are dysfunctional. Seating is tearing, rims are worn.    Assessment & Plan: 1. 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 The distal left lower extremity is most profoundly affected from a spasticity and motor standpoint.    Plan:  1.Will schedule botox for next month. 300 u left calf/gastroc/soleus/ta/tp 2. Continue with outpt PT to address tone and functional mobility, lower ext strength.  - new AFO pending  -JAS splint   -discussed surgical options as well 3. Resume/try klonopin  BID.  4. 15 minutes of face to face patient care time were spent during this visit. All questions were encouraged and answered.

## 2016-04-22 NOTE — Patient Instructions (Signed)
PLEASE CALL ME WITH ANY PROBLEMS OR QUESTIONS ([phone])  

## 2016-04-23 ENCOUNTER — Ambulatory Visit: Payer: BLUE CROSS/BLUE SHIELD

## 2016-04-27 ENCOUNTER — Ambulatory Visit: Payer: BLUE CROSS/BLUE SHIELD | Admitting: Rehabilitation

## 2016-04-30 ENCOUNTER — Ambulatory Visit: Payer: BLUE CROSS/BLUE SHIELD | Admitting: Rehabilitation

## 2016-05-21 ENCOUNTER — Ambulatory Visit: Payer: BLUE CROSS/BLUE SHIELD | Admitting: Physical Therapy

## 2016-06-01 ENCOUNTER — Encounter
Payer: BLUE CROSS/BLUE SHIELD | Attending: Physical Medicine & Rehabilitation | Admitting: Physical Medicine & Rehabilitation

## 2016-06-01 ENCOUNTER — Encounter: Payer: Self-pay | Admitting: Physical Medicine & Rehabilitation

## 2016-06-01 VITALS — BP 132/76 | HR 82

## 2016-06-01 DIAGNOSIS — I749 Embolism and thrombosis of unspecified artery: Secondary | ICD-10-CM | POA: Diagnosis not present

## 2016-06-01 DIAGNOSIS — G8389 Other specified paralytic syndromes: Secondary | ICD-10-CM | POA: Diagnosis not present

## 2016-06-01 DIAGNOSIS — IMO0002 Reserved for concepts with insufficient information to code with codable children: Secondary | ICD-10-CM

## 2016-06-01 DIAGNOSIS — G8220 Paraplegia, unspecified: Secondary | ICD-10-CM | POA: Diagnosis present

## 2016-06-01 DIAGNOSIS — G8381 Brown-Sequard syndrome: Secondary | ICD-10-CM | POA: Diagnosis not present

## 2016-06-01 DIAGNOSIS — G9511 Acute infarction of spinal cord (embolic) (nonembolic): Secondary | ICD-10-CM | POA: Diagnosis present

## 2016-06-01 DIAGNOSIS — G8110 Spastic hemiplegia affecting unspecified side: Secondary | ICD-10-CM | POA: Diagnosis not present

## 2016-06-01 NOTE — Progress Notes (Signed)
Botox Injection for spasticity using needle EMG guidance Indication: Spastic paraparesis (HCC)   Dilution: 100 Units/ml        Total Units Injected: 300 Indication: Severe spasticity which interferes with ADL,mobility and/or  hygiene and is unresponsive to medication management and other conservative care Informed consent was obtained after describing risks and benefits of the procedure with the patient. This includes bleeding, bruising, infection, excessive weakness, or medication side effects. A REMS form is on file and signed.  Needle: 54mm injectable monopolar needle electrode  Number of units per muscle  Quadriceps 0 units Gastroc/soleus 200 units in 4 separate access points Hamstrings 0 units Tibialis Posterior 60 units, 3 access points EHL 0 units Tib Anterior 40 units in 2 access points  All injections were done after obtaining appropriate EMG activity and after negative drawback for blood. The patient tolerated the procedure well. Post procedure instructions were given. Return in about 3 months (around 09/01/2016).

## 2016-06-01 NOTE — Patient Instructions (Signed)
PLEASE CALL ME WITH ANY PROBLEMS OR QUESTIONS ([phone])  

## 2016-07-02 ENCOUNTER — Encounter: Payer: BLUE CROSS/BLUE SHIELD | Admitting: Registered Nurse

## 2016-07-13 ENCOUNTER — Ambulatory Visit: Payer: BLUE CROSS/BLUE SHIELD | Admitting: Rehabilitation

## 2016-07-20 ENCOUNTER — Encounter: Payer: BLUE CROSS/BLUE SHIELD | Attending: Physical Medicine & Rehabilitation | Admitting: Registered Nurse

## 2016-07-20 DIAGNOSIS — I749 Embolism and thrombosis of unspecified artery: Secondary | ICD-10-CM | POA: Insufficient documentation

## 2016-07-20 DIAGNOSIS — G8220 Paraplegia, unspecified: Secondary | ICD-10-CM | POA: Insufficient documentation

## 2016-07-20 DIAGNOSIS — G8381 Brown-Sequard syndrome: Secondary | ICD-10-CM | POA: Insufficient documentation

## 2016-07-20 DIAGNOSIS — G8110 Spastic hemiplegia affecting unspecified side: Secondary | ICD-10-CM | POA: Insufficient documentation

## 2016-07-20 DIAGNOSIS — G9511 Acute infarction of spinal cord (embolic) (nonembolic): Secondary | ICD-10-CM | POA: Insufficient documentation

## 2016-07-31 ENCOUNTER — Encounter: Payer: Self-pay | Admitting: Rehabilitation

## 2016-07-31 ENCOUNTER — Ambulatory Visit: Payer: BLUE CROSS/BLUE SHIELD | Attending: Physical Medicine & Rehabilitation | Admitting: Rehabilitation

## 2016-07-31 DIAGNOSIS — R2681 Unsteadiness on feet: Secondary | ICD-10-CM | POA: Diagnosis present

## 2016-07-31 DIAGNOSIS — R2689 Other abnormalities of gait and mobility: Secondary | ICD-10-CM

## 2016-07-31 NOTE — Therapy (Signed)
[facility] [address] [place], [place], [address] Phone: [phone]   Fax:  [phone]  Physical Therapy Evaluation  Patient Details  Name: Donald Harvey MRN: [id number] Date of Birth: 1990/12/15 Referring Provider: Alger Simons, MD  Encounter Date: 07/31/2016      PT End of Session - 07/31/16 1319    Visit Number 1   Number of Visits 16   Date for PT Re-Evaluation 09/29/16   Authorization Type [employer]-30 visit limit combo   Authorization - Visit Number 13    Authorization - Number of Visits 30   PT Start Time 6962   PT Stop Time 1231   PT Time Calculation (min) 44 min   Activity Tolerance Patient tolerated treatment well   Behavior During Therapy [facility] for tasks assessed/performed      Past Medical History:  Diagnosis Date  . Childhood asthma   . Embolism - blood clot   . Spinal paraplegia [facility])     History reviewed. No pertinent surgical history.  There were no vitals filed for this visit.       Subjective Assessment - 07/31/16 1215    Subjective "I've had the brace about a month, but it still hurts my toe."    Pertinent History Hx of T6 SCI infarction   Limitations Walking;House hold activities   Patient Stated Goals "walking as best as I can"   Currently in Pain? Yes   Pain Score 7    Pain Location Foot   Pain Orientation Left   Pain Descriptors / Indicators Aching   Pain Type Chronic pain   Pain Onset More than a month ago   Pain Frequency Intermittent  with WB   Aggravating Factors  WB, walking   Pain Relieving Factors resting, taking brace off            [facility] PT Assessment - 07/31/16 0001      Assessment   Medical Diagnosis Spastic paraplegia   Referring Provider Alger Simons, MD   Onset Date/Surgical Date 07/10/11   Hand Dominance Left   Prior Therapy OPPT in [place] and at [facility] neuro     Precautions   Precautions Fall   Required Braces or Orthoses --  L KAFO for  amb.     Restrictions   Weight Bearing Restrictions No     Home Environment   Living Environment Private residence   Living Arrangements Parent   Available Help at Discharge Family   Type of [place] to enter   Entrance Stairs-Number of Steps 12   Entrance Stairs-Rails Can reach both   [place] One level   [place] - 2 wheels  L KAFO     Prior Function   Level of Independence Independent   Vocation Other (comment)  recent college grad seeking employment   Leisure [place], going out with friends  note pt not very active with friends, as they are [facility]     Cognition   Overall Cognitive Status Within Functional Limits for tasks assessed     Sensation   Light Touch Impaired by gross assessment   Additional Comments Decr. pain and temp and pin prick in RLE and decr. light touch in LLE.     Posture/Postural Control   Posture/Postural Control Postural limitations   Postural Limitations Flexed trunk;Rounded Shoulders     AROM   Overall AROM  Within functional limits for tasks performed     Strength  Overall Strength Deficits   Overall Strength Comments RLE WFL. LLE: hip flex: 2+/5, knee ext/flex: 3+/5, ankle PF: 2/5, ankle DF: 0/5.     Transfers   Transfers Sit to Stand;Stand to Sit   Sit to Stand With upper extremity assist;From chair/3-in-1;6: Modified independent (Device/Increase time)   Stand to Sit With upper extremity assist;To chair/3-in-1;6: Modified independent (Device/Increase time)     Ambulation/Gait   Ambulation/Gait Yes   Ambulation/Gait Assistance 5: Supervision;4: Min assist   Ambulation/Gait Assistance Details S with RW for safety, min A with use of SPC for support and facilitation for improved L forward/lateral weight shift and cues for increased L hip protraction.  Added surgical shoe cover during 2 trials of gait (once with RW and with SPC) to demonstrate improved clearance and education to pt and mother on goals  of decreasing compensatory movements.  Note marked improvement with shoe cover.  Also added small heel wedge for last bout of gait as pt reports feeling of knee hyperextension (did not note forceful extension during gait).  Pt reports improvement with heel wedge.     Ambulation Distance (Feet) 75 Feet   Assistive device Rolling walker  and L KAFO   Gait Pattern Step-through pattern;Decreased weight shift to left;Decreased dorsiflexion - left;Decreased stride length;Trunk flexed;Left hip hike   Gait velocity --     Balance   Balance Assessed --     Static Standing Balance   Static Standing - Balance Support --   Static Standing - Level of Assistance --   Static Standing Balance -  Activities  --     Standardized Balance Assessment   Standardized Balance Assessment --     Timed Up and Go Test   TUG --   Normal TUG (seconds) --                           PT Education - 07/31/16 1319    Education provided Yes   Education Details POC, goals, Pensions consultant for issues regarding brace   Person(s) Educated Patient;Parent(s)   Methods Explanation   Comprehension Verbalized understanding          PT Short Term Goals - 07/31/16 1342      PT SHORT TERM GOAL #1   Title Pt will initiate HEP in order to indicate improved functional mobility.  (Target Date: 08/28/16)   Time 4   Period Weeks   Status New     PT SHORT TERM GOAL #2   Title Will assess gait speed in order to better understand fall risk.    Time 4   Period Weeks   Status New           PT Long Term Goals - 07/31/16 1345      PT LONG TERM GOAL #1   Title Pt will be independent with HEP in order to indicate improved functional mobility and decreased fall risk.  (Target Date: 09/25/16)   Time 8   Period Weeks   Status New     PT LONG TERM GOAL #2   Title Pt will improve gait speed by .60 ft/sec from baseline w/ LRAD and AFO in order to indicate improved efficiency of gait and decreased fall  risk.    Time 8   Period Weeks   Status New     PT LONG TERM GOAL #3   Title Pt will ambulate 300' over indoor surfaces with AFO and no AD at [facility]  I level in order to indicate increased independence at home.     Time 8   Period Weeks   Status New     PT LONG TERM GOAL #4   Title Pt will ambulate 500' over paved outdoor surfaces w/ AFO and SPC at mod I level in order to indicate safe return to community and increased independence.     Time 8   Period Weeks   Status New     PT LONG TERM GOAL #5   Title Pt will verbalize making plans to go to job fairs/interviews in hopes of returning to work.     Time 8   Period Weeks   Status New               Plan - 07/31/16 1337    Clinical Impression Statement Skilled session focused on brief re-evaluation of pt (as he has not been seen in three months due to getting new brace for LLE).  Pt returns with similar deficits as previously mentioned, however gait seemed much improved with new L AFO.  He continues to have pain at L 5th met head with callus and redness noted at area.  Also note that they did not pursue static progressive splint for LLE and PT still feels that he could benefit from this.  Will speak with [facility] regarding matter.  Discussed goals with pt and mother as PT feels that he could be ambulatory with SPC vs no device.  Pt and mother verbalized understanding.  Pt will continue to benefit from skilled OP neuro to address remaining deficits.     Rehab Potential Good   Clinical Impairments Affecting Rehab Potential Hx of SCI infarct   PT Frequency 2x / week   PT Duration 8 weeks  Will likely only need 4 weeks, pending progress   PT Treatment/Interventions ADLs/Self Care Home Management;Biofeedback;Balance training;Manual techniques;Therapeutic exercise;Therapeutic activities;Orthotic Fit/Training;Functional mobility training;Stair training;Gait training;Wheelchair mobility training;Patient/family education;DME  Instruction;Neuromuscular re-education   PT Next Visit Plan Hope to have Gerald Stabs present to address brace issues (pressure, heel wedge and addition of toe cap to L shoe), work on LLE NMR for hip protraction, glute med activation, gait with SPC   Consulted and Agree with Plan of Care Patient      Patient will benefit from skilled therapeutic intervention in order to improve the following deficits and impairments:  Abnormal gait, Decreased endurance, Impaired flexibility, Decreased coordination, Decreased balance, Decreased mobility, Decreased skin integrity, Decreased knowledge of use of DME, Decreased strength, Impaired tone, Decreased range of motion, Hypomobility, Impaired perceived functional ability, Improper body mechanics, Postural dysfunction  Visit Diagnosis: Unsteadiness on feet - Plan: PT plan of care cert/re-cert  Other abnormalities of gait and mobility - Plan: PT plan of care cert/re-cert     Problem List Patient Active Problem List   Diagnosis Date Noted  . 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 Spinal cord infarction ([place]) 05/31/2013  . Food allergy 06/19/2012  . 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 Hypercoagulable state ([place]) 09/22/2011  . Neurogenic bladder 09/22/2011  . Neurogenic bowel 09/22/2011    Cameron Sprang, PT, MPT [facility] [address] [place], [place], [address] Phone: [phone]   Fax:  [phone] 07/31/16, 2:10 PM  Name: CLEMENS LACHMAN MRN: [id number] Date of Birth: [birth year]

## 2016-08-20 ENCOUNTER — Ambulatory Visit: Payer: BLUE CROSS/BLUE SHIELD | Attending: Physical Medicine & Rehabilitation | Admitting: Rehabilitation

## 2016-08-20 ENCOUNTER — Encounter: Payer: Self-pay | Admitting: Rehabilitation

## 2016-08-20 DIAGNOSIS — R2689 Other abnormalities of gait and mobility: Secondary | ICD-10-CM | POA: Insufficient documentation

## 2016-08-20 DIAGNOSIS — G9511 Acute infarction of spinal cord (embolic) (nonembolic): Secondary | ICD-10-CM

## 2016-08-20 DIAGNOSIS — R2681 Unsteadiness on feet: Secondary | ICD-10-CM | POA: Diagnosis not present

## 2016-08-20 NOTE — Therapy (Signed)
[facility] [address] [place], [place], [address] Phone: [phone]   Fax:  [phone]  Physical Therapy Treatment  Patient Details  Name: Donald Harvey MRN: [id number] Date of Birth: [birth year] Referring Provider: Faith Rogue, MD  Encounter Date: 08/20/2016      PT End of Session - 08/20/16 1655    Visit Number 2   Number of Visits 16   Date for PT Re-Evaluation 09/29/16   Authorization Type BC[employer]-30 visit limit combo   Authorization - Visit Number 14  note error in visits made on 5/30   Authorization - Number of Visits 30   PT Start Time 1321  pt late to appt   PT Stop Time 1400   PT Time Calculation (min) 39 min   Activity Tolerance Patient tolerated treatment well   Behavior During Therapy [facility] for tasks assessed/performed      Past Medical History:  Diagnosis Date  . Childhood asthma   . Embolism - blood clot   . Spinal paraplegia [facility])     History reviewed. No pertinent surgical history.  There were no vitals filed for this visit.      Subjective Assessment - 08/20/16 1647    Subjective "I was sick Monday and Tuesday and I threw up last night."    Pertinent History Hx of T6 SCI infarction   Limitations Walking;House hold activities   Patient Stated Goals "walking as best as I can"   Currently in Pain? No/denies                         [facility] Adult PT Treatment/Exercise - 08/20/16 1330      Ambulation/Gait   Ambulation/Gait Yes   Ambulation/Gait Assistance 5: Supervision;4: Min assist;3: Mod assist   Ambulation/Gait Assistance Details Performed gait x 2 reps of 115' with RW initially due to pt having different shoes donned.  Note marked improvement in L foot clearance in these shoes and seemingly improved stability.  Pt removed insert in L shoe following single bout of gait due to tightness reported.  Performed another 22' with quad tip cane during session in order to  address postural control, improved L lateral and forward weight shift with improved L hip protraction during stance phase of gait.    Assistive device Rolling walker  quad tip cane   Gait Pattern Step-through pattern;Decreased weight shift to left;Decreased dorsiflexion - left;Decreased stride length;Trunk flexed;Left hip hike   Ambulation Surface Level;Indoor     Neuro Re-ed    Neuro Re-ed Details  supine B LE bridging x 10 reps, LLE only bridging with LLE off EOM on step, however this caused increased pain in L knee, therefore transitioned to L foot placed on mat with RLE extended to decrease use x 10 reps total (still minor pain, however cued to perform as able to tolerate, if not perform B bridging), SL hip abduction (clam) x 10 reps, then in SL with LLE extended x 10 reps.  Note difficulty with leg in full extension, however cued to perform at home without brace and shoe for glute med activation.  Feel that pt compliance may be an issue as he has gotten these exercises before but is not performing them.   ended with standing exercise with single UE support advancing and retro stepping RLE in order to work on improved activation and proximal stability/strength in LLE.  Note increased tone at ankle today, feel this may be due to  recent illness.                  PT Education - 08/20/16 1654    Education provided Yes   Education Details Education on verbal HEP, importance of walking vs reliance on w/c   Person(s) Educated Patient;Parent(s)   Methods Explanation   Comprehension Verbalized understanding          PT Short Term Goals - 07/31/16 1342      PT SHORT TERM GOAL #1   Title Pt will initiate HEP in order to indicate improved functional mobility.  (Target Date: 08/28/16)   Time 4   Period Weeks   Status New     PT SHORT TERM GOAL #2   Title Will assess gait speed in order to better understand fall risk.    Time 4   Period Weeks   Status New           PT Long Term  Goals - 07/31/16 1345      PT LONG TERM GOAL #1   Title Pt will be independent with HEP in order to indicate improved functional mobility and decreased fall risk.  (Target Date: 09/25/16)   Time 8   Period Weeks   Status New     PT LONG TERM GOAL #2   Title Pt will improve gait speed by .60 ft/sec from baseline w/ LRAD and AFO in order to indicate improved efficiency of gait and decreased fall risk.    Time 8   Period Weeks   Status New     PT LONG TERM GOAL #3   Title Pt will ambulate 300' over indoor surfaces with AFO and no AD at mod I level in order to indicate increased independence at home.     Time 8   Period Weeks   Status New     PT LONG TERM GOAL #4   Title Pt will ambulate 500' over paved outdoor surfaces w/ AFO and SPC at mod I level in order to indicate safe return to community and increased independence.     Time 8   Period Weeks   Status New     PT LONG TERM GOAL #5   Title Pt will verbalize making plans to go to job fairs/interviews in hopes of returning to work.     Time 8   Period Weeks   Status New               Plan - 08/20/16 1655    Clinical Impression Statement Skilled session continues to focus on gait and NMR for LLE with L AFO with LRAD.  Pt with recent illness and has not been walking at home, arrives today in w/c.  Cues to increase activity as able as not to lose strength gained.     Rehab Potential Good   Clinical Impairments Affecting Rehab Potential Hx of SCI infarct   PT Frequency 2x / week   PT Duration 8 weeks  Will likely only need 4 weeks, pending progress   PT Treatment/Interventions ADLs/Self Care Home Management;Biofeedback;Balance training;Manual techniques;Therapeutic exercise;Therapeutic activities;Orthotic Fit/Training;Functional mobility training;Stair training;Gait training;Wheelchair mobility training;Patient/family education;DME Instruction;Neuromuscular re-education   PT Next Visit Plan  work on LLE NMR for hip  protraction, glute med activation, gait with SPC with AFO   Consulted and Agree with Plan of Care Patient      Patient will benefit from skilled therapeutic intervention in order to improve the following deficits and impairments:  Abnormal gait, Decreased  endurance, Impaired flexibility, Decreased coordination, Decreased balance, Decreased mobility, Decreased skin integrity, Decreased knowledge of use of DME, Decreased strength, Impaired tone, Decreased range of motion, Hypomobility, Impaired perceived functional ability, Improper body mechanics, Postural dysfunction  Visit Diagnosis: Unsteadiness on feet  Other abnormalities of gait and mobility  Spinal cord infarction [facility](HCC)     Problem List Patient Active Problem List   Diagnosis Date Noted  . 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 Food allergy 06/19/2012  . 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 Hypercoagulable state ([facility]) 09/22/2011  . Neurogenic bladder 09/22/2011  . Neurogenic bowel 09/22/2011   Harriet ButteEmily Lenvil Swaim, PT, MPT [facility] [address] [place], [place], [address] Phone: [phone]   Fax:  [phone] 08/20/16, 4:59 PM  Name: Modena MorrowJeremy E Titsworth MRN: [id number] Date of Birth: [birth year]

## 2016-08-21 ENCOUNTER — Encounter: Payer: Self-pay | Admitting: Rehabilitation

## 2016-08-21 ENCOUNTER — Ambulatory Visit: Payer: BLUE CROSS/BLUE SHIELD | Admitting: Rehabilitation

## 2016-08-21 DIAGNOSIS — R2689 Other abnormalities of gait and mobility: Secondary | ICD-10-CM

## 2016-08-21 DIAGNOSIS — R2681 Unsteadiness on feet: Secondary | ICD-10-CM

## 2016-08-21 DIAGNOSIS — G9511 Acute infarction of spinal cord (embolic) (nonembolic): Secondary | ICD-10-CM

## 2016-08-21 NOTE — Therapy (Signed)
[facility] [address] [place], [place], [address] Phone: [phone]   Fax:  [phone]  Physical Therapy Treatment  Patient Details  Name: Donald Harvey MRN: [id number] Date of Birth: [birth year] Referring Provider: Faith Rogue, MD  Encounter Date: 08/21/2016      PT End of Session - 08/21/16 1456    Visit Number 3   Number of Visits 16   Date for PT Re-Evaluation 09/29/16   Authorization Type [employer]-30 visit limit combo   Authorization - Visit Number 15  note error in visits made on 5/30   Authorization - Number of Visits 30   PT Start Time 1448   PT Stop Time 1530   PT Time Calculation (min) 42 min   Activity Tolerance Patient tolerated treatment well   Behavior During Therapy [facility] for tasks assessed/performed      Past Medical History:  Diagnosis Date  . Childhood asthma   . Embolism - blood clot   . Spinal paraplegia [facility])     History reviewed. No pertinent surgical history.  There were no vitals filed for this visit.      Subjective Assessment - 08/21/16 1455    Subjective "I'm feeling better."    Pertinent History Hx of T6 SCI infarction   Limitations Walking;House hold activities   Patient Stated Goals "walking as best as I can"   Currently in Pain? Yes   Pain Score 6    Pain Location Foot   Pain Orientation Left   Pain Descriptors / Indicators Aching   Pain Type Chronic pain   Pain Onset More than a month ago   Pain Frequency Intermittent   Aggravating Factors  WB walking   Pain Relieving Factors rest                         [facility] Adult PT Treatment/Exercise - 08/21/16 1500      Ambulation/Gait   Ambulation/Gait Yes   Ambulation/Gait Assistance 3: Mod assist;4: Min assist   Ambulation/Gait Assistance Details Worked on gait for NMR purposes without AD.  Emphasis on upright posture, adequate forward and lateral weight shift to the L during stance phase of gait,  and slower/larger R step length.  Performed two bouts x 25' without AD.  Note increased pain in L foot from brace and due to inadequate postural control and increased tone, L ankle continues to invert and pt needing to stop and rest.  Also performed x 115' with quad tip cane to allow some UE support during gait.  Again  needs cues for posture and facilitation at pelvis for weight shift and hip protraction.  Again, has increased tone and needing seated rest break following single lap.    Assistive device None  quad tip cane   Gait Pattern Step-through pattern;Decreased weight shift to left;Decreased dorsiflexion - left;Decreased stride length;Trunk flexed;Left hip hike   Ambulation Surface Level;Indoor     Self-Care   Self-Care Other Self-Care Comments   Other Self-Care Comments  Had lengthy discusssion with pt and mother regarding increasing pt activity to work on endurance so that when we progress to cane or LRAD he has adequate activity tolerance.  Also enocuraged pt to ambulate outside and to begin walking program.   Note that he is still using w/c in house at home, therefore talked about getting rid of chair in house.  Also asked pt about getting a job as PT feels that this would greatly  effect affect and overall morale.  Pt states that he is looking for job but has not found anything yet.       Neuro Re-ed    Neuro Re-ed Details  NMR for L glute med with LLE on floor and R knee placed on therapy mat reaching upward and to the L to increase active push through LLE.  Requires constant cues and facilitation for adequate weight shift onto LLE.  Tranisitioned to LLE (knee) on mat with RLE on floor reaching to the L with LUE moving RLE from ground to work on proximal control in LLE.  Performed x 15 reps with intermittent rest breaks.  Note good ability to activate during session, however difficulty maintaining during gait.                  PT Education - 08/21/16 1705    Education provided Yes    Education Details Education on Research scientist (life sciences)calling [employer] for any brace needs/modifications, increasing walking   Person(s) Educated Patient;Parent(s)   Methods Explanation   Comprehension Verbalized understanding          PT Short Term Goals - 07/31/16 1342      PT SHORT TERM GOAL #1   Title Pt will initiate HEP in order to indicate improved functional mobility.  (Target Date: 08/28/16)   Time 4   Period Weeks   Status New     PT SHORT TERM GOAL #2   Title Will assess gait speed in order to better understand fall risk.    Time 4   Period Weeks   Status New           PT Long Term Goals - 07/31/16 1345      PT LONG TERM GOAL #1   Title Pt will be independent with HEP in order to indicate improved functional mobility and decreased fall risk.  (Target Date: 09/25/16)   Time 8   Period Weeks   Status New     PT LONG TERM GOAL #2   Title Pt will improve gait speed by .60 ft/sec from baseline w/ LRAD and AFO in order to indicate improved efficiency of gait and decreased fall risk.    Time 8   Period Weeks   Status New     PT LONG TERM GOAL #3   Title Pt will ambulate 300' over indoor surfaces with AFO and no AD at mod I level in order to indicate increased independence at home.     Time 8   Period Weeks   Status New     PT LONG TERM GOAL #4   Title Pt will ambulate 500' over paved outdoor surfaces w/ AFO and SPC at mod I level in order to indicate safe return to community and increased independence.     Time 8   Period Weeks   Status New     PT LONG TERM GOAL #5   Title Pt will verbalize making plans to go to job fairs/interviews in hopes of returning to work.     Time 8   Period Weeks   Status New               Plan - 08/21/16 1457    Clinical Impression Statement Skilled session focused on NMR for LLE with forced use and WB activites as well as gait without AD, see self care details on discussion regarding increased activity and getting out of house more.   Also continue to discuss bracing needs and  encouraged him to call [employer].     Rehab Potential Good   Clinical Impairments Affecting Rehab Potential Hx of SCI infarct   PT Frequency 2x / week   PT Duration 8 weeks  Will likely only need 4 weeks, pending progress   PT Treatment/Interventions ADLs/Self Care Home Management;Biofeedback;Balance training;Manual techniques;Therapeutic exercise;Therapeutic activities;Orthotic Fit/Training;Functional mobility training;Stair training;Gait training;Wheelchair mobility training;Patient/family education;DME Instruction;Neuromuscular re-education   PT Next Visit Plan  work on LLE NMR for hip protraction, glute med activation, gait with SPC with AFO   Consulted and Agree with Plan of Care Patient      Patient will benefit from skilled therapeutic intervention in order to improve the following deficits and impairments:  Abnormal gait, Decreased endurance, Impaired flexibility, Decreased coordination, Decreased balance, Decreased mobility, Decreased skin integrity, Decreased knowledge of use of DME, Decreased strength, Impaired tone, Decreased range of motion, Hypomobility, Impaired perceived functional ability, Improper body mechanics, Postural dysfunction  Visit Diagnosis: Unsteadiness on feet  Other abnormalities of gait and mobility  Spinal cord infarction [facility])     Problem List Patient Active Problem List   Diagnosis Date Noted  . 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 Food allergy 06/19/2012  . 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 Hypercoagulable state (HCC) 09/22/2011  . Neurogenic bladder 09/22/2011  . Neurogenic bowel 09/22/2011    Harriet Butte, PT, MPT [facility] [address] [place], [place], [address] Phone: [phone]   Fax:  [phone] 08/21/16, 5:08 PM  Name: Donald Harvey MRN: [id number] Date of Birth: 1991/07/13

## 2016-08-24 ENCOUNTER — Ambulatory Visit: Payer: BLUE CROSS/BLUE SHIELD | Admitting: Rehabilitation

## 2016-08-24 ENCOUNTER — Telehealth: Payer: Self-pay | Admitting: Rehabilitation

## 2016-08-24 ENCOUNTER — Telehealth: Payer: Self-pay | Admitting: Internal Medicine

## 2016-08-24 DIAGNOSIS — R2681 Unsteadiness on feet: Secondary | ICD-10-CM

## 2016-08-24 DIAGNOSIS — D6859 Other primary thrombophilia: Secondary | ICD-10-CM

## 2016-08-24 DIAGNOSIS — G9511 Acute infarction of spinal cord (embolic) (nonembolic): Secondary | ICD-10-CM

## 2016-08-24 DIAGNOSIS — R2689 Other abnormalities of gait and mobility: Secondary | ICD-10-CM

## 2016-08-24 NOTE — Telephone Encounter (Signed)
Dr. Demetrius Revel,   Hey guys, I am seeing Donald Harvey for PT at [facility].  He is saying that his prescription for Klonopin has ran out and needs more.  I am seeing much more tone in therapy and having trouble gait training due to increased ankle inversion, even in his AFO.  He is to call you today as well to get filled, but I am also sending a message to hopefully expedite process.    Thanks,  Harriet Butte, PT, MPT [facility] [address] [place], [place], [address] Phone: [phone]   Fax:  [phone] 08/24/16, 1:34 PM

## 2016-08-24 NOTE — Therapy (Signed)
[facility] [address] [place], [place], [address] Phone: [phone]   Fax:  [phone]  Patient Details  Name: Donald Harvey MRN: [id number] Date of Birth: [birth year] Referring Provider:  Gordy SaversKwiatkowski, Peter F, MD  Encounter Date: 08/24/2016   Note pt arrived for scheduled appt today and has not had time to get brace modified and/or get prescription for tone management medication refilled, therefore did not see pt for visit as he has a visit limit.     Harriet ButteEmily Demita Tobia, PT, MPT [facility] [address] [place], [place], [address] Phone: [phone]   Fax:  [phone] 08/24/16, 1:36 PM

## 2016-08-24 NOTE — Telephone Encounter (Signed)
Mom states Donald Harvey would like a new neurologist due to the fact Donald Harvey does not get along well with current neuro md Dr Riley KillSwartz. . Donald Harvey would like referral to Dr Dara LordsAhrens

## 2016-08-25 MED ORDER — CLONAZEPAM 1 MG PO TABS: 1.0000 mg | ORAL_TABLET | Freq: Two times a day (BID) | ORAL | 0 refills | Status: DC

## 2016-08-25 NOTE — Telephone Encounter (Signed)
Order for referral to Neurology sent with request of new provider.

## 2016-08-25 NOTE — Telephone Encounter (Signed)
1 month refill ordered, pt has appt with Dr. Riley KillSwartz on Monday, October 30th

## 2016-08-26 ENCOUNTER — Telehealth: Payer: Self-pay

## 2016-08-26 NOTE — Telephone Encounter (Signed)
Mother called stating that she has called every [facility] pharmacy in the area and none of them have her sons medication.  I contacted the pharmacy that I placed the order with.  They said they had received it on 08/25/2016 and it had not been processed yet.   I asked the pharmacist to contact pt's mother. He agreed. I attempted to call pt's mother as well, left voice message

## 2016-08-26 NOTE — Telephone Encounter (Signed)
Patients' mother left Voicemail inquiring about medication refill and what pharmacy was medication sent to. Informed mother.

## 2016-08-28 ENCOUNTER — Encounter: Payer: Self-pay | Admitting: Rehabilitation

## 2016-08-28 ENCOUNTER — Ambulatory Visit: Payer: BLUE CROSS/BLUE SHIELD | Admitting: Rehabilitation

## 2016-08-28 DIAGNOSIS — R2681 Unsteadiness on feet: Secondary | ICD-10-CM | POA: Diagnosis not present

## 2016-08-28 DIAGNOSIS — G9511 Acute infarction of spinal cord (embolic) (nonembolic): Secondary | ICD-10-CM

## 2016-08-28 DIAGNOSIS — R2689 Other abnormalities of gait and mobility: Secondary | ICD-10-CM

## 2016-08-28 NOTE — Therapy (Signed)
[facility] [address] [place], [place], [address] Phone: [phone]   Fax:  [phone]  Physical Therapy Treatment  Patient Details  Name: Donald Harvey MRN: [id number] Date of Birth: 16-Oct-1991 Referring Provider: Faith Rogue, MD  Encounter Date: 08/28/2016      PT End of Session - 08/28/16 1700    Visit Number 4   Number of Visits 16   Date for PT Re-Evaluation 09/29/16   Authorization Type [employer]-30 visit limit combo   Authorization - Visit Number 16  note error in visits made on 5/30   Authorization - Number of Visits 30   PT Start Time 1316   PT Stop Time 1405   PT Time Calculation (min) 49 min   Activity Tolerance Patient tolerated treatment well   Behavior During Therapy [facility] for tasks assessed/performed      Past Medical History:  Diagnosis Date  . Childhood asthma   . Embolism - blood clot   . Spinal paraplegia [facility])     History reviewed. No pertinent surgical history.  There were no vitals filed for this visit.      Subjective Assessment - 08/28/16 1325    Subjective Reports brace is doing better so far.  Hasn't done a lot of walking on it yet.    Pertinent History Hx of T6 SCI infarction   Limitations Walking;House hold activities   Patient Stated Goals "walking as best as I can"   Currently in Pain? Yes   Pain Score 3    Pain Location Foot   Pain Orientation Left   Pain Descriptors / Indicators Aching   Pain Type Chronic pain   Pain Onset More than a month ago   Pain Frequency Intermittent   Aggravating Factors  WB, walking   Pain Relieving Factors rest                         [facility] Adult PT Treatment/Exercise - 08/28/16 1330      Ambulation/Gait   Ambulation/Gait Yes   Ambulation/Gait Assistance 4: Min assist   Ambulation/Gait Assistance Details had pt ambulate with quad tip cane with light min A from therapist so that OT Bretta Bang) could observe  gait deviations to further assess without device.  Continue to provide cues for upright posture, forward weight shift over LLE and safety with cane.     Ambulation Distance (Feet) 115 Feet   Assistive device --  quad tip cane, none during NMR   Gait Pattern Step-through pattern;Decreased weight shift to left;Decreased dorsiflexion - left;Decreased stride length;Trunk flexed;Left hip hike   Ambulation Surface Level;Indoor     Self-Care   Self-Care Other Self-Care Comments   Other Self-Care Comments  Discussed that this PT would contact Chris from [employer] to see if we can anchor ankle strap at a different location in order to increase foot pronation.       Neuro Re-ed    Neuro Re-ed Details  Continue to work on [employer] during gait with brace donned while PT provided assist for proper alignment during WB on LLE during stance and OT Bretta Bang) provided facilitation and cues for upright posture, forward L hip protraction during L stance, taking shorter slower step on RLE and improved L lateral and forward weight shift.  Note pt able to take many steps with improved ability to decrease gait deviations, however is limited due to lax ligaments on lateral aspect of ankle due to continued  tone and mal positioning.  Performed 57' in this manner with very slow gait speed to emphasize cues listed previously.  Then progressed to NMR tasks without brace.  OT providing assist for proper alignment at ankle/tibia with sit<>stand.  Performed x 5 reps with cues for increased forward weight shift.                 PT Education - 08/28/16 1659    Education provided Yes   Education Details Continued to educate on making modifications to brace   Person(s) Educated Patient   Methods Explanation   Comprehension Verbalized understanding          PT Short Term Goals - 07/31/16 1342      PT SHORT TERM GOAL #1   Title Pt will initiate HEP in order to indicate improved functional mobility.  (Target Date:  08/28/16)   Time 4   Period Weeks   Status New     PT SHORT TERM GOAL #2   Title Will assess gait speed in order to better understand fall risk.    Time 4   Period Weeks   Status New           PT Long Term Goals - 07/31/16 1345      PT LONG TERM GOAL #1   Title Pt will be independent with HEP in order to indicate improved functional mobility and decreased fall risk.  (Target Date: 09/25/16)   Time 8   Period Weeks   Status New     PT LONG TERM GOAL #2   Title Pt will improve gait speed by .60 ft/sec from baseline w/ LRAD and AFO in order to indicate improved efficiency of gait and decreased fall risk.    Time 8   Period Weeks   Status New     PT LONG TERM GOAL #3   Title Pt will ambulate 300' over indoor surfaces with AFO and no AD at mod I level in order to indicate increased independence at home.     Time 8   Period Weeks   Status New     PT LONG TERM GOAL #4   Title Pt will ambulate 500' over paved outdoor surfaces w/ AFO and SPC at mod I level in order to indicate safe return to community and increased independence.     Time 8   Period Weeks   Status New     PT LONG TERM GOAL #5   Title Pt will verbalize making plans to go to job fairs/interviews in hopes of returning to work.     Time 8   Period Weeks   Status New               Plan - 08/28/16 1700    Clinical Impression Statement Skilled session focused on NMR with emphasis on getting L ankle in proper alignment, both during gait and functional movements.     Rehab Potential Good   Clinical Impairments Affecting Rehab Potential Hx of SCI infarct   PT Frequency 2x / week   PT Duration 8 weeks  Will likely only need 4 weeks, pending progress   PT Treatment/Interventions ADLs/Self Care Home Management;Biofeedback;Balance training;Manual techniques;Therapeutic exercise;Therapeutic activities;Orthotic Fit/Training;Functional mobility training;Stair training;Gait training;Wheelchair mobility  training;Patient/family education;DME Instruction;Neuromuscular re-education   PT Next Visit Plan  work on LLE NMR for hip protraction, glute med activation-do this with brace off (really have to get in floor to maintain ankle alignment)   Consulted and Agree with  Plan of Care Patient      Patient will benefit from skilled therapeutic intervention in order to improve the following deficits and impairments:  Abnormal gait, Decreased endurance, Impaired flexibility, Decreased coordination, Decreased balance, Decreased mobility, Decreased skin integrity, Decreased knowledge of use of DME, Decreased strength, Impaired tone, Decreased range of motion, Hypomobility, Impaired perceived functional ability, Improper body mechanics, Postural dysfunction  Visit Diagnosis: Unsteadiness on feet  Other abnormalities of gait and mobility  Spinal cord infarction [facility](HCC)     Problem List Patient Active Problem List   Diagnosis Date Noted  . 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 Food allergy 06/19/2012  . 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 Hypercoagulable state (HCC) 09/22/2011  . Neurogenic bladder 09/22/2011  . Neurogenic bowel 09/22/2011    Vista Deckarcell, Mirelle Biskup Ann 08/28/2016, 5:03 PM  [facility] [address] [place], [place], [address] Phone: [phone]   Fax:  [phone]  Name: Modena MorrowJeremy E Timberman MRN: [id number] Date of Birth: 1991/06/19

## 2016-08-31 ENCOUNTER — Encounter: Payer: BLUE CROSS/BLUE SHIELD | Admitting: Physical Medicine & Rehabilitation

## 2016-09-02 ENCOUNTER — Ambulatory Visit: Payer: BLUE CROSS/BLUE SHIELD | Attending: Physical Medicine & Rehabilitation | Admitting: Physical Therapy

## 2016-09-02 DIAGNOSIS — R2689 Other abnormalities of gait and mobility: Secondary | ICD-10-CM | POA: Insufficient documentation

## 2016-09-02 DIAGNOSIS — G9511 Acute infarction of spinal cord (embolic) (nonembolic): Secondary | ICD-10-CM | POA: Diagnosis present

## 2016-09-02 DIAGNOSIS — R2681 Unsteadiness on feet: Secondary | ICD-10-CM | POA: Insufficient documentation

## 2016-09-02 NOTE — Therapy (Signed)
[facility] [address] [place], [place], [address] Phone: [phone]   Fax:  [phone]  Physical Therapy Treatment  Patient Details  Name: Donald Harvey MRN: [id number] Date of Birth: 06/08/1991 Referring Provider: Alger Simons, MD  Encounter Date: 09/02/2016      PT End of Session - 09/02/16 1415    Visit Number 5   Number of Visits 16   Date for PT Re-Evaluation 09/29/16   Authorization Type BC[employer]-30 visit limit combo   Authorization - Visit Number 40   Authorization - Number of Visits 30   PT Start Time 7858   PT Stop Time 1359   PT Time Calculation (min) 42 min   Activity Tolerance Patient tolerated treatment well   Behavior During Therapy Donald Harvey      Past Medical History:  Diagnosis Date  . Childhood asthma   . Embolism - blood clot   . Spinal paraplegia ([place])     No past surgical history on file.  There were no vitals filed for this visit.      Subjective Assessment - 09/02/16 1321    Subjective Pt saw Donald Harvey at [facility] to shave down area of increased pressure/discomfort on L foot (points to 5th met head, plantar aspect of foot).  Pt did take medication for spasticity today.   Patient is accompained by: Family member  mother   Pertinent History Hx of T6 SCI infarction   Limitations Walking;House hold activities   Patient Stated Goals "walking as best as I can"   Currently in Pain? Yes   Pain Score 3    Pain Location Ankle   Pain Orientation Left;Medial   Pain Descriptors / Indicators Aching   Pain Type Chronic pain   Pain Onset More than a month ago   Pain Frequency Intermittent   Aggravating Factors  WB, walking   Pain Relieving Factors rest                         [facility] Adult PT Treatment/Exercise - 09/02/16 0001      Ambulation/Gait   Ambulation/Gait Yes   Ambulation/Gait Assistance 5: Supervision   Ambulation/Gait Assistance  Details Gait x230' over level, indoor surafaces with RW, L AFO, and NMES at L peroneals with supervision, cueing for anterior weight shift, pelvic protraction during L stance.   Ambulation Distance (Feet) 230 Feet   Assistive device Rolling walker;Other (Comment)  L custom AFO; NMES at L peroneals   Gait Pattern Step-through pattern;Decreased weight shift to left;Decreased dorsiflexion - left;Decreased stride length;Trunk flexed;Left hip hike;Poor foot clearance - left  RLE vaulting   Ambulation Surface Level;Indoor   Gait Comments With NMES at L peroneals, pt performed blocked practice of RLE tapping onto targets (anterior, medial) with single UE support, no L AFO, and therapist providing manual stabilization of L ankle to prevent excessive supination/instability. Cueing emphasized L pelvic protraction, anterior weight shift, and L gluteus medius activation during L stance. Pt requires max manual stabilization to prevent excessive L forefoot supination during 10-sec off time of NMES.                PT Education - 09/02/16 1406    Education provided Yes   Education Details Recommended pt Electronics engineer to make [facility] aware of L ankle pain and instability, even when wearing L AFO.   Person(s) Educated Patient   Methods Explanation   Comprehension Verbalized understanding  PT Short Term Goals - 07/31/16 1342      PT SHORT TERM GOAL #1   Title Pt will initiate HEP in order to indicate improved functional mobility.  (Target Date: 08/28/16)   Time 4   Period Weeks   Status New     PT SHORT TERM GOAL #2   Title Will assess gait speed in order to better understand fall risk.    Time 4   Period Weeks   Status New           PT Long Term Goals - 07/31/16 1345      PT LONG TERM GOAL #1   Title Pt will be independent with HEP in order to indicate improved functional mobility and decreased fall risk.  (Target Date: 09/25/16)   Time 8   Period Weeks   Status New      PT LONG TERM GOAL #2   Title Pt will improve gait speed by .60 ft/sec from baseline w/ LRAD and AFO in order to indicate improved efficiency of gait and decreased fall risk.    Time 8   Period Weeks   Status New     PT LONG TERM GOAL #3   Title Pt will ambulate 300' over indoor surfaces with AFO and no AD at mod I level in order to indicate increased independence at home.     Time 8   Period Weeks   Status New     PT LONG TERM GOAL #4   Title Pt will ambulate 500' over paved outdoor surfaces w/ AFO and SPC at mod I level in order to indicate safe return to community and increased independence.     Time 8   Period Weeks   Status New     PT LONG TERM GOAL #5   Title Pt will verbalize making plans to go to job fairs/interviews in hopes of returning to work.     Time 8   Period Weeks   Status New               Plan - 09/02/16 1415    Clinical Impression Statement Session focused on continued pre-gait/LLE NMR with emphasis on L ankle alignment during stance phase of gait. Pt continues to exhibit significant L forefoot supination, ankle instability during L midstance. Noted mild-moderate improvement in L ankle stability using NMES on L peroneal muscle group; however, pt unable to activate peroneals without onset of significant extensor tone, causing supination.   Rehab Potential Good   Clinical Impairments Affecting Rehab Potential Hx of SCI infarct   PT Frequency 2x / week   PT Duration 8 weeks  Will likely only need 4 weeks, pending progress   PT Treatment/Interventions ADLs/Self Care Home Management;Biofeedback;Balance training;Manual techniques;Therapeutic exercise;Therapeutic activities;Orthotic Fit/Training;Functional mobility training;Stair training;Gait training;Wheelchair mobility training;Patient/family education;DME Instruction;Neuromuscular re-education   PT Next Visit Plan  work on LLE NMR for hip protraction, glute med activation-do this with brace off (really have to  get in floor to maintain ankle alignment)   Recommended Other Services 11/1: botox vs. more restrictive L AFO? Donald Harvey, let me know what you think.   Consulted and Agree with Plan of Care Patient;Family member/caregiver   Family Member Consulted mother      Patient will benefit from skilled therapeutic intervention in order to improve the following deficits and impairments:  Abnormal gait, Decreased endurance, Impaired flexibility, Decreased coordination, Decreased balance, Decreased mobility, Decreased skin integrity, Decreased knowledge of use of DME, Decreased strength, Impaired tone,  Decreased range of motion, Hypomobility, Impaired perceived functional ability, Improper body mechanics, Postural dysfunction  Visit Diagnosis: Unsteadiness on feet  Other abnormalities of gait and mobility     Problem List Patient Active Problem List   Diagnosis Date Noted  . 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 Spinal cord infarction ([place]) 05/31/2013  . Food allergy 06/19/2012  . Paraplegic spinal paralysis ([place]) 09/22/2011  . Hypercoagulable state ([place]) 09/22/2011  . Neurogenic bladder 09/22/2011  . Neurogenic bowel 09/22/2011    Billie Ruddy, PT, DPT [facility] [address] [place], [place], [address] Phone: [phone]   Fax:  [phone] 09/02/16, 2:21 PM  Name: Donald Harvey MRN: [id number] Date of Birth: 12-16-1990

## 2016-09-04 ENCOUNTER — Encounter: Payer: Self-pay | Admitting: Neurology

## 2016-09-04 ENCOUNTER — Ambulatory Visit: Payer: BLUE CROSS/BLUE SHIELD | Admitting: Rehabilitation

## 2016-09-04 ENCOUNTER — Ambulatory Visit (INDEPENDENT_AMBULATORY_CARE_PROVIDER_SITE_OTHER): Payer: BLUE CROSS/BLUE SHIELD | Admitting: Neurology

## 2016-09-04 ENCOUNTER — Encounter: Payer: Self-pay | Admitting: Rehabilitation

## 2016-09-04 VITALS — BP 116/72 | HR 81 | Ht 74.0 in | Wt 198.4 lb

## 2016-09-04 DIAGNOSIS — R2681 Unsteadiness on feet: Secondary | ICD-10-CM

## 2016-09-04 DIAGNOSIS — G9511 Acute infarction of spinal cord (embolic) (nonembolic): Secondary | ICD-10-CM | POA: Diagnosis not present

## 2016-09-04 DIAGNOSIS — R2689 Other abnormalities of gait and mobility: Secondary | ICD-10-CM

## 2016-09-04 DIAGNOSIS — IMO0002 Reserved for concepts with insufficient information to code with codable children: Secondary | ICD-10-CM

## 2016-09-04 DIAGNOSIS — G8220 Paraplegia, unspecified: Secondary | ICD-10-CM

## 2016-09-04 MED ORDER — ASPIRIN EC 81 MG PO TBEC: 81.0000 mg | DELAYED_RELEASE_TABLET | Freq: Every day | ORAL | 6 refills | Status: DC

## 2016-09-04 MED ORDER — BACLOFEN 10 MG PO TABS: 10.0000 mg | ORAL_TABLET | Freq: Three times a day (TID) | ORAL | 11 refills | Status: DC

## 2016-09-04 NOTE — Patient Instructions (Addendum)
As far as your medications are concerned, I would like to suggest:  Baclofen 10mg  three times a day Aspirin 81mg  a day  As far as diagnostic testing: Second opinion with Dr. Terrace ArabiaYan for Botox, Referral to [facility]  Our phone number is [phone]. We also have an after hours call service for urgent matters and there is a physician on-call for urgent questions. For any emergencies you know to call 911 or go to the nearest emergency room  Baclofen tablets What is this medicine? BACLOFEN (BAK loe fen) helps relieve spasms and cramping of muscles. It may be used to treat symptoms of multiple sclerosis or spinal cord injury. This medicine may be used for other purposes; ask your health care provider or pharmacist if you have questions. What should I tell my health care provider before I take this medicine? They need to know if you have any of these conditions: -kidney disease -seizures -stroke -an unusual or allergic reaction to baclofen, other medicines, foods, dyes, or preservatives -pregnant or trying to get pregnant -breast-feeding How should I use this medicine? Take this medicine by mouth. Swallow it with a drink of water. Follow the directions on the prescription label. Do not take more medicine than you are told to take. Talk to your pediatrician regarding the use of this medicine in children. Special care may be needed. Overdosage: If you think you have taken too much of this medicine contact a poison control center or emergency room at once. NOTE: This medicine is only for you. Do not share this medicine with others. What if I miss a dose? If you miss a dose, take it as soon as you can. If it is almost time for your next dose, take only that dose. Do not take double or extra doses. What may interact with this medicine? -alcohol -antihistamines -medicines for depression, anxiety, and other mental conditions -medicines for pain like codeine, oxycodone, tramadol, and  propoxyphene -medicines for sleep -phenobarbital This list may not describe all possible interactions. Give your health care provider a list of all the medicines, herbs, non-prescription drugs, or dietary supplements you use. Also tell them if you smoke, drink alcohol, or use illegal drugs. Some items may interact with your medicine. What should I watch for while using this medicine? Do not suddenly stop taking your medicine. If you do, you may develop a severe reaction. If your doctor wants you to stop the medicine, the dose will be slowly lowered over time to avoid any side effects. Follow the advice of your doctor. Baclofen can affect blood sugar levels. If you have diabetes, talk with your doctor or health care professional before you take the medicine. You may get drowsy or dizzy when you first start taking the medicine or change doses. Do not drive, use machinery, or do anything that may be dangerous until you know how the medicine affects you. Stand or sit up slowly. What side effects may I notice from receiving this medicine? Side effects that you should report to your doctor or health care professional as soon as possible: -allergic reactions like skin rash, itching or hives, swelling of the face, lips, or tongue -chest pain -hallucinations -seizure Side effects that usually do not require medical attention (report to your doctor or health care professional if they continue or are bothersome): -confusion -difficulty sleeping -headache -nausea This list may not describe all possible side effects. Call your doctor for medical advice about side effects. You may report side effects to FDA at 1-800-FDA-1088.  Where should I keep my medicine? Keep out of the reach of children. Store at room temperature between 15 and 30 degrees C (59 and 86 degrees F). Keep container tightly closed. Throw away any unused medicine after the expiration date. NOTE: This sheet is a summary. It may not cover all  possible information. If you have questions about this medicine, talk to your doctor, pharmacist, or health care provider.    2016, Elsevier/Gold Standard. (2008-01-19 13:22:23)

## 2016-09-04 NOTE — Therapy (Signed)
[facility] [address] [place], [place], [address] Phone: [phone]   Fax:  [phone]  Physical Therapy Treatment  Patient Details  Name: Donald Harvey MRN: [id number] Date of Birth: Dec 06, 1990 Referring Provider: Alger Simons, MD  Encounter Date: 09/04/2016      PT End of Session - 09/04/16 1910    Visit Number 6   Number of Visits 16   Date for PT Re-Evaluation 09/29/16   Authorization Type BCBS-30 visit limit combo   Authorization - Visit Number 18   Authorization - Number of Visits 30   PT Start Time 1316   PT Stop Time 1400   PT Time Calculation (min) 44 min   Activity Tolerance Patient tolerated treatment well   Behavior During Therapy [facility] for tasks assessed/performed      Past Medical History:  Diagnosis Date  . Broken ankle   . Childhood asthma   . Embolism - blood clot   . Spinal paraplegia [facility])     Past Surgical History:  Procedure Laterality Date  . NO PAST SURGERIES      There were no vitals filed for this visit.      Subjective Assessment - 09/04/16 1904    Subjective Reports increased pain today as he has been wearing brace all morning and ambulating to doctor's appt.     Patient is accompained by: Family member   Pertinent History Hx of T6 SCI infarction   Limitations Walking;House hold activities   Patient Stated Goals "walking as best as I can"   Currently in Pain? Yes   Pain Score 8    Pain Location Foot   Pain Orientation Left;Lateral   Pain Descriptors / Indicators Aching   Pain Type Chronic pain   Pain Onset More than a month ago   Pain Frequency Intermittent   Aggravating Factors  WB, walking   Pain Relieving Factors rest, removing brace                         [facility] Adult PT Treatment/Exercise - 09/04/16 0001      Self-Care   Self-Care Other Self-Care Comments   Other Self-Care Comments  Discussed taking order for static progressive  splint to [facility] as [facility] reports that they no longer do these braces.  Pt and mother verbalized understanding.  Continue to educate that PT feels that big component of ankle issues is lateral laxity and medial tightness and feel that splinting and possible casting would be best option to place ankle in optimal position.       Neuro Re-ed    Neuro Re-ed Details  Addressed NMR for LLE with sit<>stand, placing RLE up/down to 3" step, forward/retro stepping with RLE, and upward reaching with LUE all to increase weight shift onto LLE and improved activation in LLE during pregait and functional tasks.  PT assisting with all NMR tasks to ensure safe placement of L ankle during tasks.  Pt continues to demonstrate anxiety when having to place full weight on LLE and tends to make excuses for not being able to perform tasks.  Educate on importance of tasks and why they are difficult for pt.                  PT Education - 09/04/16 1909    Education provided Yes   Education Details Taking order to [facility] for static progressive brace/splint for positioning.     Person(s)  Educated Patient;Parent(s)   Methods Explanation   Comprehension Verbalized understanding          PT Short Term Goals - 09/04/16 1912      PT SHORT TERM GOAL #1   Title Pt will initiate HEP in order to indicate improved functional mobility.  (Target Date: 08/28/16)   Baseline met    Time 4   Period Weeks   Status Achieved     PT SHORT TERM GOAL #2   Title Will assess gait speed in order to better understand fall risk.    Baseline gait speed goal no appropriate due to increased tone   Time 4   Period Weeks   Status Deferred           PT Long Term Goals - 07/31/16 1345      PT LONG TERM GOAL #1   Title Pt will be independent with HEP in order to indicate improved functional mobility and decreased fall risk.  (Target Date: 09/25/16)   Time 8   Period Weeks   Status New     PT LONG TERM GOAL  #2   Title Pt will improve gait speed by .60 ft/sec from baseline w/ LRAD and AFO in order to indicate improved efficiency of gait and decreased fall risk.    Time 8   Period Weeks   Status New     PT LONG TERM GOAL #3   Title Pt will ambulate 300' over indoor surfaces with AFO and no AD at mod I level in order to indicate increased independence at home.     Time 8   Period Weeks   Status New     PT LONG TERM GOAL #4   Title Pt will ambulate 500' over paved outdoor surfaces w/ AFO and SPC at mod I level in order to indicate safe return to community and increased independence.     Time 8   Period Weeks   Status New     PT LONG TERM GOAL #5   Title Pt will verbalize making plans to go to job fairs/interviews in hopes of returning to work.     Time 8   Period Weeks   Status New               Plan - 09/04/16 1911    Clinical Impression Statement Skilled session focused on pre-gait and LLE NMR with emphasis on L ankle alignment during stance.  PT able to control during session, but note continued difficulty in weight shift and L hip protraction during stance.     Rehab Potential Good   Clinical Impairments Affecting Rehab Potential Hx of SCI infarct   PT Frequency 2x / week   PT Duration 8 weeks  Will likely only need 4 weeks, pending progress   PT Treatment/Interventions ADLs/Self Care Home Management;Biofeedback;Balance training;Manual techniques;Therapeutic exercise;Therapeutic activities;Orthotic Fit/Training;Functional mobility training;Stair training;Gait training;Wheelchair mobility training;Patient/family education;DME Instruction;Neuromuscular re-education   PT Next Visit Plan  work on LLE NMR for hip protraction, glute med activation-do this with brace off (really have to get in floor to maintain ankle alignment)   Consulted and Agree with Plan of Care Patient;Family member/caregiver   Family Member Consulted mother      Patient will benefit from skilled  therapeutic intervention in order to improve the following deficits and impairments:  Abnormal gait, Decreased endurance, Impaired flexibility, Decreased coordination, Decreased balance, Decreased mobility, Decreased skin integrity, Decreased knowledge of use of DME, Decreased strength, Impaired tone, Decreased range  of motion, Hypomobility, Impaired perceived functional ability, Improper body mechanics, Postural dysfunction  Visit Diagnosis: Unsteadiness on feet  Other abnormalities of gait and mobility  Spinal cord infarction [facility])     Problem List Patient Active Problem List   Diagnosis Date Noted  . 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 Spinal cord infarction ([place]) 05/31/2013  . Food allergy 06/19/2012  . 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 Hypercoagulable state ([place]) 09/22/2011  . Neurogenic bladder 09/22/2011  . Neurogenic bowel 09/22/2011    Cameron Sprang, PT, MPT [facility] [address] [place], [place], [address] Phone: [phone]   Fax:  [phone] 09/04/16, 7:15 PM  Name: Donald Harvey MRN: [id number] Date of Birth: Dec 29, 1990

## 2016-09-04 NOTE — Progress Notes (Signed)
[facility]    Provider:  Dr Lucia GaskinsAhern Referring Provider: Gordy Harvey, Donald Harvey, Donald Harvey Primary Care Physician:  Donald Harvey,Donald FRANK, Donald Harvey  CC:  Hx of Spinal cord infarction 2012  HPI:  Donald Harvey is a 25 y.o. male here as a referral from Dr. 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 He [facility] to [facility] in 2012 with acute onset of weakness and numbness on 07/10/2011 without inciting event or trauma.He was transferred to [facility] after imaging thought to reveal transverse myelitis. He received high-dose steroids and workup eventually showed a likely primary coagulable disorder and venous sinus thrombosis but an extensive hypercoagulable panel was negative.  He has been seeing physical rehab. 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 Patient has been seeing Dr. Doroteo BradfordKirstein in physical rehab. There is no pain. He has left achilles tendon contracture. He has resultant spasticity and clonus and paraparesis. He was diagnosed and treated at [facility] and started on Coumadin, he does not wish to continue coumadin despite recommendations to do so and he has been seen by hematology here in [facility] who discussed this with him as well and he is on daily aspirin currently. He understands the risks of further thrombosis and that coumadin could decrease this risk. He uses a walker. He does not feel he is improving and he has not been back to [facility] for [facility] and would like to be seen there again. He does not feel physicians are doing enough for him. I had a long talk with patient and mother. I understand his frustration. He has been seeing Dr. Riley KillSwartz whose management has been excellent with medications for spasticity as well as botox, physical therapy, stretching, assistive devices. He has tried  baclofen and klonopin in the past. Patient is interested in further botox but feels he has not received much improvement, I explained that botox unfortunately may not completely resolve his spasticity but it is a method to try and improve spasticity and botox dosage can be adjusted in further botox sessions. Mother is here and provides information as well. He has not had any further events or new symptoms. He has been stable for many years. No other focal neurologic deficits, modifying factors or associated symptoms. He is currently participating in physical therapy.     Reviewed notes, labs and imaging from outside physicians, which showed:  Mr. Donald Harvey was in his usual state of excellent health when noted onset of numbness and weakness in his lower extremities beginning on 07/10/2011. This weakness and sensory loss progressed where he represented to [facility] on 07/11/2011 and underwent MRI which was felt to be suggestive of transverse myelitis. He was then transferred to [facility]. He received high-dose steroids with continued decline of lower extremities strength. He continued with extensive workups which eventually demonstrated a likely primary coagulopathy with hypercoagulable state and concordant incidental venous sinus thrombosis which ultimately indicated, because of the symptoms, being a spinal cord infarct with a resultant paraplegia. He had an extensive hypercoagulable workup performed with all results normal except for a low plasminogen level elevated thrombin clot time and low fibrinogen level. Patient was transferred to acute rehab for intensive OT and PT. He was recommended to stay on long-term coumadin.    MRI 07/2011:   IMPRESSION:  Intraluminal filling defect within the right  transverse and sigmoid sinus highly suspicious of dural venous sinus thrombosis. A CT venogram may be helpful to confirm.  Since the MRI of the spine  of July 11, 2011, the T2 hyperintense cord signal abnormality has increased in extent and signal intensity and now extends from T1 to T11.Given the interval increase in extent, the differential would include transverse myelitis and an atypical spinal cord infarct, raising the question of spinal cord vasculopathy or, given the dural sinus finding, spinal venous thrombosis.  A new low signal intensity lesion at the level of T7-T8 within the central/ventral spinal cord could represent a focus of hemorrhage.    Review of Systems: Patient complains of symptoms per HPI as well as the following symptoms: no CP, no SOB. Pertinent negatives per HPI. All others negative.   Social History   Social History  . Marital status: Single    Spouse name: N/A  . Number of children: 0  . Years of education: 16.5    Occupational History  . Unemployed    Social History Main Topics  . Smoking status: Never Smoker  . Smokeless tobacco: Never Used  . Alcohol use Yes     Comment: [place]  . Drug use: No  . Sexual activity: Not on file   Other Topics Concern  . Not on file   Social History Narrative   Lives w/ mother   Caffeine use: Tea/soda- couple times per month    History reviewed. No pertinent family history.  Past Medical History:  Diagnosis Date  . Broken ankle   . Childhood asthma   . Embolism - blood clot   . Spinal paraplegia [facility])     Past Surgical History:  Procedure Laterality Date  . NO PAST SURGERIES      Current Outpatient Prescriptions  Medication Sig Dispense Refill  . acetaminophen (TYLENOL) 500 MG tablet Take 1,000 mg by mouth as needed.    . clonazePAM (KLONOPIN) 1 MG tablet Take 1 tablet (1 mg total) by mouth 2 (two) times daily. 60 tablet 0   No current facility-administered medications for this visit.     Allergies as of 09/04/2016 - Review Complete 09/04/2016  Allergen Reaction Noted  . Shellfish allergy  06/13/2012    Vitals: BP 116/72 (BP  Location: Left Arm, Patient Position: Sitting, Cuff Size: Normal)   Pulse 81   Ht 6\' 2"  (1.88 m)   Wt 198 lb 6.4 oz (90 kg)   BMI 25.47 kg/m  Last Weight:  Wt Readings from Last 1 Encounters:  09/04/16 198 lb 6.4 oz (90 kg)   Last Height:   Ht Readings from Last 1 Encounters:  09/04/16 6\' 2"  (1.88 m)    Physical exam: Exam: Gen: NAD, conversant                  CV: RRR, no MRG. No Carotid Bruits. No peripheral edema, warm, nontender Eyes: Conjunctivae clear without exudates or hemorrhage  Neuro: Detailed Neurologic Exam  Speech:    Speech is normal; fluent and spontaneous with normal comprehension.  Cognition:    The patient is oriented to person, place, and time;     recent and remote memory intact;     language fluent;     normal attention, concentration,     fund of knowledge Cranial Nerves:    The pupils are equal, round, and reactive to light. The fundi are normal and spontaneous venous pulsations are present. Visual fields are full to finger confrontation. Extraocular movements  are intact. Trigeminal sensation is intact and the muscles of mastication are normal. The face is symmetric. The palate elevates in the midline. Hearing intact. Voice is normal. Shoulder shrug is normal. The tongue has normal motion without fasciculations.   Coordination:    Normal finger to nose. Cannot perform heel to shin due to paraparesis.  Gait:    Spastic gait with a walker, walks with left foot inversion  Motor Observation:    no involuntary movements noted. Tone:    Increased tone left >> right lower extremities  Posture:    Posture is normal. normal erect    Strength:    UE 5/5, left hip flexion and leg flexion 2/5, left DF 1/5, left PF 1/5 with very tight cord, right leg 4+/5. Weakness of left foot eversion > inversion.      Sensation: decreased in the lower extremities     Reflex Exam:  DTR's: 2+ uppers. persistent clonus left AJ, 3+ in the left lower extremity. Right  lower extremity 2+.     Toes:    The toes are downgoing bilaterally.   Clonus:    Clonus is absent.    Assessment/Plan:  25 y.o. male here as a referral from Dr. 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   - He is in physical therapy currently. Continue, address stretching. - Patient and mother like a referral back to [facility], will provide - I recommend continued botox for spasticity. He would like to have it completed her [facility] instead of at physical rehab will refer to Dr. Terrace ArabiaYan here in our office. - He has an AFO as well as a joint/rom splint. T1-T11. Surgical options have also been discussed with patient. Continue daily stretching for heel cord.  - Continue Klonopin. It makes him tired, will try adding baclofen as well.  - Patient understands the risks of not being on coumadin. He has been evaluated by hematology and wished to continue with aspirin daily, declines coumadin   Donald DeanAntonia Nathanuel Cabreja, Donald Harvey  [facility] [address] [place], [place] [address]  Phone [phone] Fax [phone]  Cc: Donald Harvey,Donald FRANK, Donald Harvey

## 2016-09-07 ENCOUNTER — Ambulatory Visit: Payer: BLUE CROSS/BLUE SHIELD | Admitting: Rehabilitation

## 2016-09-07 ENCOUNTER — Encounter: Payer: Self-pay | Admitting: Rehabilitation

## 2016-09-07 DIAGNOSIS — G9511 Acute infarction of spinal cord (embolic) (nonembolic): Secondary | ICD-10-CM

## 2016-09-07 DIAGNOSIS — R2689 Other abnormalities of gait and mobility: Secondary | ICD-10-CM

## 2016-09-07 DIAGNOSIS — R2681 Unsteadiness on feet: Secondary | ICD-10-CM

## 2016-09-07 NOTE — Therapy (Signed)
[facility] [address] [place], [place], [address] Phone: [phone]   Fax:  [phone]  Physical Therapy Treatment  Patient Details  Name: Donald Harvey MRN: [id number] Date of Birth: November 16, 1990 Referring Provider: Alger Simons, MD  Encounter Date: 09/07/2016      PT End of Session - 09/07/16 1921    Visit Number 7   Number of Visits 16   Date for PT Re-Evaluation 09/29/16   Authorization Type [employer]-30 visit limit combo   Authorization - Visit Number 59   Authorization - Number of Visits 30   PT Start Time 5852   PT Stop Time 1405   PT Time Calculation (min) 48 min   Activity Tolerance Patient tolerated treatment well   Behavior During Therapy [facility] for tasks assessed/performed      Past Medical History:  Diagnosis Date  . Broken ankle   . Childhood asthma   . Embolism - blood clot   . Spinal paraplegia [facility])     Past Surgical History:  Procedure Laterality Date  . NO PAST SURGERIES      There were no vitals filed for this visit.      Subjective Assessment - 09/07/16 1324    Subjective Reports that foot is placing better in brace and shoe while seated, seems to be a little better in standing.     Patient is accompained by: Family member   Pertinent History Hx of T6 SCI infarction   Limitations Walking;House hold activities   Patient Stated Goals "walking as best as I can"   Currently in Pain? Yes   Pain Score 4    Pain Location Foot   Pain Orientation Left   Pain Descriptors / Indicators Aching   Pain Type Chronic pain   Pain Onset More than a month ago   Pain Frequency Constant   Aggravating Factors  WB, walking   Pain Relieving Factors rest, removing brace                         [facility] Adult PT Treatment/Exercise - 09/07/16 0001      Ambulation/Gait   Ambulation/Gait Yes   Ambulation/Gait Assistance 4: Min guard;4: Min assist  for facilitation   Ambulation/Gait  Assistance Details Ambulated during session several bouts with varying attempts at controlling L ankle.  Added t strap first to top of carbon fiber part of brace to better prevent ankle inversion.  Ambulated x 115' with RW at [facility] I level.  Note mild improvement, however pt notes no big change.  Donned t strap in different way, on bottom of molded insert and then to bottom of AFO strapping more into supination.  This did help more and note pt states increased comfort.  Ankle still somewhat unstable, therefore donned lace up ankle brace under his full brace and ambulated another 230' with quad tip cane with marked improvement in ankle stability noted.  Feel that this is best option at this time for safety, comfort and to allow for decreased gait deviations.  Pt verablized understanding and will see about purchasing one.     Ambulation Distance (Feet) 115 Feet  x 2, then 230' x 1    Assistive device Rolling walker  quad tip cane   Gait Pattern Step-through pattern;Decreased weight shift to left;Decreased dorsiflexion - left;Decreased stride length;Trunk flexed;Left hip hike;Poor foot clearance - left   Ambulation Surface Level;Indoor     Self-Care   Self-Care Other  Self-Care Comments   Other Self-Care Comments  Discussed that PT would attempt to contact splinting company directly to find out who covers them and process regarding ordering.  Also discussed purchase of lace up ankle brace to allow maximal comfort in brace.  Pt verbalized understanding.                  PT Education - 09/07/16 1920    Education provided Yes   Education Details PT to call splinting company directly regarding splint.    Person(s) Educated Patient;Parent(s)   Methods Explanation   Comprehension Verbalized understanding          PT Short Term Goals - 09/04/16 1912      PT SHORT TERM GOAL #1   Title Pt will initiate HEP in order to indicate improved functional mobility.  (Target Date: 08/28/16)   Baseline  met    Time 4   Period Weeks   Status Achieved     PT SHORT TERM GOAL #2   Title Will assess gait speed in order to better understand fall risk.    Baseline gait speed goal no appropriate due to increased tone   Time 4   Period Weeks   Status Deferred           PT Long Term Goals - 07/31/16 1345      PT LONG TERM GOAL #1   Title Pt will be independent with HEP in order to indicate improved functional mobility and decreased fall risk.  (Target Date: 09/25/16)   Time 8   Period Weeks   Status New     PT LONG TERM GOAL #2   Title Pt will improve gait speed by .60 ft/sec from baseline w/ LRAD and AFO in order to indicate improved efficiency of gait and decreased fall risk.    Time 8   Period Weeks   Status New     PT LONG TERM GOAL #3   Title Pt will ambulate 300' over indoor surfaces with AFO and no AD at mod I level in order to indicate increased independence at home.     Time 8   Period Weeks   Status New     PT LONG TERM GOAL #4   Title Pt will ambulate 500' over paved outdoor surfaces w/ AFO and SPC at mod I level in order to indicate safe return to community and increased independence.     Time 8   Period Weeks   Status New     PT LONG TERM GOAL #5   Title Pt will verbalize making plans to go to job fairs/interviews in hopes of returning to work.     Time 8   Period Weeks   Status New               Plan - 09/07/16 1921    Clinical Impression Statement Skilled session focused on gait training with varying additions to brace for best stability/safety of ankle.  Note that lace up ankle brace under his AFO seems to cause best stability and comfort.     Rehab Potential Good   Clinical Impairments Affecting Rehab Potential Hx of SCI infarct   PT Frequency 2x / week   PT Duration 8 weeks  Will likely only need 4 weeks, pending progress   PT Treatment/Interventions ADLs/Self Care Home Management;Biofeedback;Balance training;Manual techniques;Therapeutic  exercise;Therapeutic activities;Orthotic Fit/Training;Functional mobility training;Stair training;Gait training;Wheelchair mobility training;Patient/family education;DME Instruction;Neuromuscular re-education   PT Next Visit Plan  work on  LLE NMR for hip protraction, glute med activation-do this with brace off (really have to get in floor to maintain ankle alignment)   Consulted and Agree with Plan of Care Patient;Family member/caregiver   Family Member Consulted mother      Patient will benefit from skilled therapeutic intervention in order to improve the following deficits and impairments:  Abnormal gait, Decreased endurance, Impaired flexibility, Decreased coordination, Decreased balance, Decreased mobility, Decreased skin integrity, Decreased knowledge of use of DME, Decreased strength, Impaired tone, Decreased range of motion, Hypomobility, Impaired perceived functional ability, Improper body mechanics, Postural dysfunction  Visit Diagnosis: Unsteadiness on feet  Other abnormalities of gait and mobility  Spinal cord infarction [facility])     Problem List Patient Active Problem List   Diagnosis Date Noted  . 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 Spinal cord infarction ([place]) 05/31/2013  . Food allergy 06/19/2012  . 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 Hypercoagulable state ([place]) 09/22/2011  . Neurogenic bladder 09/22/2011  . Neurogenic bowel 09/22/2011    Cameron Sprang, PT, MPT [facility] [address] [place], [place], [address] Phone: [phone]   Fax:  [phone] 09/07/16, 7:24 PM  Name: UBALDO DAYWALT MRN: [id number] Date of Birth: 07/06/1991

## 2016-09-11 ENCOUNTER — Ambulatory Visit: Payer: BLUE CROSS/BLUE SHIELD | Admitting: Rehabilitation

## 2016-09-11 ENCOUNTER — Encounter: Payer: Self-pay | Admitting: Rehabilitation

## 2016-09-11 DIAGNOSIS — G9511 Acute infarction of spinal cord (embolic) (nonembolic): Secondary | ICD-10-CM

## 2016-09-11 DIAGNOSIS — R2689 Other abnormalities of gait and mobility: Secondary | ICD-10-CM

## 2016-09-11 DIAGNOSIS — R2681 Unsteadiness on feet: Secondary | ICD-10-CM | POA: Diagnosis not present

## 2016-09-11 NOTE — Therapy (Signed)
[facility] [address] [place], [place], [address] Phone: [phone]   Fax:  [phone]  Physical Therapy Treatment  Patient Details  Name: Donald Harvey MRN: [id number] Date of Birth: December 01, 1990 Referring Provider: Alger Simons, MD  Encounter Date: 09/11/2016      PT End of Session - 09/11/16 1342    Visit Number 8   Number of Visits 16   Date for PT Re-Evaluation 09/29/16   Authorization Type BCBS-30 visit limit combo   Authorization - Visit Number 20   Authorization - Number of Visits 30   PT Start Time 8921   PT Stop Time 1402   PT Time Calculation (min) 45 min   Activity Tolerance Patient tolerated treatment well   Behavior During Therapy [facility] for tasks assessed/performed      Past Medical History:  Diagnosis Date  . Broken ankle   . Childhood asthma   . Embolism - blood clot   . Spinal paraplegia [name])     Past Surgical History:  Procedure Laterality Date  . NO PAST SURGERIES      There were no vitals filed for this visit.      Subjective Assessment - 09/11/16 1953    Subjective Reports feeling tired from medication.    Patient is accompained by: Family member   Pertinent History Hx of T6 SCI infarction   Limitations Walking;House hold activities   Patient Stated Goals "walking as best as I can"   Currently in Pain? Yes   Pain Score 3    Pain Location Foot   Pain Orientation Left   Pain Descriptors / Indicators Aching   Pain Type Chronic pain   Pain Onset More than a month ago   Pain Frequency Intermittent   Aggravating Factors  WB, walking   Pain Relieving Factors rest, removing brace                         [facility] Adult PT Treatment/Exercise - 09/11/16 0001      Ambulation/Gait   Ambulation/Gait Yes   Ambulation/Gait Assistance 4: Min guard;4: Min assist  for facilitation   Ambulation/Gait Assistance Details Continue to address gait with lace up ankle  brace with L AFO and today used SPC for gait.  Performed x 230' with facilitaiton/emphasis on improved posture, improved L hip protraction during stance, increased L forward/lateral weight shift during L stance and wider stance.  Note marked improvement in L ankle stability during stance.  He continues to want to avoid LLE somewhat, but improvement noted in comfort in loading LLE.     Ambulation Distance (Feet) 230 Feet   Assistive device Straight cane  bari cane due to increased height   Gait Pattern Step-through pattern;Decreased weight shift to left;Decreased dorsiflexion - left;Decreased stride length;Trunk flexed;Left hip hike;Poor foot clearance - left   Ambulation Surface Level;Indoor     Self-Care   Self-Care Other Self-Care Comments   Other Self-Care Comments  Theresia Lo, rep from [name] present during first portion of session in order to assess need for splinting to increase L ankle ROM and flexibility.  After assessing pts ability to get ankle into neutral position, feel that static progressive splint most appropriate and therefore measurements taken and notes/order provided to [name].  Pt and mother verbalized understanding.  Also discussed pt purchasing ankle brace to allow for pt to increase gait at home.  PT states she may have one at  home pt could have, will follow up.  Also discussed cane use as PT feels he could begin this at home.  Pt feels that RW was ordered over 5 years ago and therefore would qualify for cane.  Will ask MD for order.                  PT Education - 09/11/16 2002    Education provided Yes   Education Details [facility] static progressive spint, increasing gait at home since borrowed lace up ankle brace from clinic.     Person(s) Educated Patient;Parent(s)   Methods Explanation;Demonstration   Comprehension Verbalized understanding          PT Short Term Goals - 09/04/16 1912      PT SHORT TERM GOAL #1   Title Pt will initiate HEP in  order to indicate improved functional mobility.  (Target Date: 08/28/16)   Baseline met    Time 4   Period Weeks   Status Achieved     PT SHORT TERM GOAL #2   Title Will assess gait speed in order to better understand fall risk.    Baseline gait speed goal no appropriate due to increased tone   Time 4   Period Weeks   Status Deferred           PT Long Term Goals - 07/31/16 1345      PT LONG TERM GOAL #1   Title Pt will be independent with HEP in order to indicate improved functional mobility and decreased fall risk.  (Target Date: 09/25/16)   Time 8   Period Weeks   Status New     PT LONG TERM GOAL #2   Title Pt will improve gait speed by .60 ft/sec from baseline w/ LRAD and AFO in order to indicate improved efficiency of gait and decreased fall risk.    Time 8   Period Weeks   Status New     PT LONG TERM GOAL #3   Title Pt will ambulate 300' over indoor surfaces with AFO and no AD at mod I level in order to indicate increased independence at home.     Time 8   Period Weeks   Status New     PT LONG TERM GOAL #4   Title Pt will ambulate 500' over paved outdoor surfaces w/ AFO and SPC at mod I level in order to indicate safe return to community and increased independence.     Time 8   Period Weeks   Status New     PT LONG TERM GOAL #5   Title Pt will verbalize making plans to go to job fairs/interviews in hopes of returning to work.     Time 8   Period Weeks   Status New               Plan - 09/11/16 2004    Clinical Impression Statement Skilled session with [name] from [facility] for static progressive splint.  Continue to work on gait with lace up ankle brace, AFO and SPC.     Rehab Potential Good   Clinical Impairments Affecting Rehab Potential Hx of SCI infarct   PT Frequency 2x / week   PT Duration 8 weeks  Will likely only need 4 weeks, pending progress   PT Treatment/Interventions ADLs/Self Care Home Management;Biofeedback;Balance training;Manual  techniques;Therapeutic exercise;Therapeutic activities;Orthotic Fit/Training;Functional mobility training;Stair training;Gait training;Wheelchair mobility training;Patient/family education;DME Instruction;Neuromuscular re-education   PT Next Visit Plan  work on LLE NMR-do this  with brace off (really have to get in floor to maintain ankle alignment) (have done NMES without good success)-could work on sit<>stand, reaching tasks, stepping up RLE (pt somewhat anxious about WB on LLE) also note he tends to "excuse" why he can't shift onto LLE or maintain posture-its just new for him.     Recommended Other Services Raquel Sarna to ask for order for SPC-follow up as able.     Consulted and Agree with Plan of Care Patient;Family member/caregiver   Family Member Consulted mother      Patient will benefit from skilled therapeutic intervention in order to improve the following deficits and impairments:  Abnormal gait, Decreased endurance, Impaired flexibility, Decreased coordination, Decreased balance, Decreased mobility, Decreased skin integrity, Decreased knowledge of use of DME, Decreased strength, Impaired tone, Decreased range of motion, Hypomobility, Impaired perceived functional ability, Improper body mechanics, Postural dysfunction  Visit Diagnosis: Unsteadiness on feet  Other abnormalities of gait and mobility  Spinal cord infarction [facility])     Problem List Patient Active Problem List   Diagnosis Date Noted  . 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 Spinal cord infarction ([place]) 05/31/2013  . Food allergy 06/19/2012  . 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 Hypercoagulable state ([place]) 09/22/2011  . Neurogenic bladder 09/22/2011  . Neurogenic bowel 09/22/2011    Cameron Sprang, PT, MPT [facility] [address] [place], [place], [address] Phone: [phone]   Fax:  [phone] 09/11/16, 8:10 PM  Name: Donald Harvey MRN: [id number] Date of  Birth: 04/21/1991

## 2016-09-15 ENCOUNTER — Ambulatory Visit: Payer: BLUE CROSS/BLUE SHIELD | Admitting: Physical Therapy

## 2016-09-15 ENCOUNTER — Encounter: Payer: Self-pay | Admitting: Physical Therapy

## 2016-09-15 DIAGNOSIS — R2681 Unsteadiness on feet: Secondary | ICD-10-CM

## 2016-09-15 DIAGNOSIS — R2689 Other abnormalities of gait and mobility: Secondary | ICD-10-CM

## 2016-09-15 NOTE — Therapy (Signed)
[facility] [address] [place], [place], [address] Phone: [phone]   Fax:  [phone]  Physical Therapy Treatment  Patient Details  Name: Donald Harvey MRN: [id number] Date of Birth: 06-02-1991 Referring Provider: Alger Simons, MD  Encounter Date: 09/15/2016      PT End of Session - 09/15/16 1539    Visit Number 9   Number of Visits 16   Date for PT Re-Evaluation 09/29/16   Authorization Type BCBS-30 visit limit combo   Authorization - Visit Number 21   Authorization - Number of Visits 30   PT Start Time 3710   PT Stop Time 1615   PT Time Calculation (min) 45 min   Activity Tolerance Patient tolerated treatment well   Behavior During Therapy [facility] for tasks assessed/performed      Past Medical History:  Diagnosis Date  . Broken ankle   . Childhood asthma   . Embolism - blood clot   . Spinal paraplegia [facility])     Past Surgical History:  Procedure Laterality Date  . NO PAST SURGERIES      There were no vitals filed for this visit.      Subjective Assessment - 09/15/16 1537    Subjective Reports having some left ankle pain on Saturday with brace on when walking on lateral aspect. Not hurting today, however his pressure sore/area on bottom on toe is hurting today.    Patient is accompained by: Family member   Pertinent History Hx of T6 SCI infarction   Limitations Walking;House hold activities   Patient Stated Goals "walking as best as I can"   Currently in Pain? Yes   Pain Score 1    Pain Location Toe (Comment which one)   Pain Orientation Left   Pain Descriptors / Indicators Aching;Sore   Pain Type Chronic pain   Pain Onset More than a month ago   Pain Frequency Intermittent   Aggravating Factors  WB, walking   Pain Relieving Factors rest, removing brace              [facility] Adult PT Treatment/Exercise - 09/15/16 2146      Neuro Re-ed    Neuro Re-ed Details  focused on left LE  weight bearing and weight shifting to left side with the following activities: with right foot on 4 inch box sit<>stands 2 sets of 12 reps, manual assist for left ankle stabilization/knee stabilizaton to prevent genu recuvatum; left stance with right stepping up to a 4 inch box x 5 reps, 2 sets with multiple seated rest breaks in between reps due to pt "loosing balance" and sitting on mat;left stance with right stepping over 1/2 foam roll and back 2 sets of 10 reps, increased time due to multiple balance losses posteriorly as pt tends to overstep backwards and demo's poor hip positioning with fwd/bwd stepping. cues also needed for left knee extension, weight shifting and pelvic movements/positioning with all mobility, manual assistance needed for left ankle stability and to prevent knee genu recurvatum.                                                       Knee/Hip Exercises: Stretches   Passive Hamstring Stretch Left;2 reps;60 seconds;Limitations   Passive Hamstring Stretch Limitations seated edge of mat, passive stretching prior to neuro/balance  activities.    Other Knee/Hip Stretches left heel cord stretching passively, seated edge of mat. 3 reps for 60 seconds each.             PT Short Term Goals - 09/04/16 1912      PT SHORT TERM GOAL #1   Title Pt will initiate HEP in order to indicate improved functional mobility.  (Target Date: 08/28/16)   Baseline met    Time 4   Period Weeks   Status Achieved     PT SHORT TERM GOAL #2   Title Will assess gait speed in order to better understand fall risk.    Baseline gait speed goal no appropriate due to increased tone   Time 4   Period Weeks   Status Deferred           PT Long Term Goals - 07/31/16 1345      PT LONG TERM GOAL #1   Title Pt will be independent with HEP in order to indicate improved functional mobility and decreased fall risk.  (Target Date: 09/25/16)   Time 8   Period Weeks   Status New     PT LONG TERM GOAL #2    Title Pt will improve gait speed by .60 ft/sec from baseline w/ LRAD and AFO in order to indicate improved efficiency of gait and decreased fall risk.    Time 8   Period Weeks   Status New     PT LONG TERM GOAL #3   Title Pt will ambulate 300' over indoor surfaces with AFO and no AD at mod I level in order to indicate increased independence at home.     Time 8   Period Weeks   Status New     PT LONG TERM GOAL #4   Title Pt will ambulate 500' over paved outdoor surfaces w/ AFO and SPC at mod I level in order to indicate safe return to community and increased independence.     Time 8   Period Weeks   Status New     PT LONG TERM GOAL #5   Title Pt will verbalize making plans to go to job fairs/interviews in hopes of returning to work.     Time 8   Period Weeks   Status New           Plan - 09/15/16 1539    Clinical Impression Statement Today's skilled session focused on increased weight shifting/weight bearing onto left leg. Pt continues to need cues for weight shifting and posture/hip and knee extension in stance. Pt reports some pain in his hamstring with weight shifting and hip/knee extension. Pt also needs constant cues to keep left foot planted on floor, especially the heel with balance activities. Pt is making steady progress toward goals and should benefit from continued PT to progress toward unmet goals.                          Rehab Potential Good   Clinical Impairments Affecting Rehab Potential Hx of SCI infarct   PT Frequency 2x / week   PT Duration 8 weeks  Will likely only need 4 weeks, pending progress   PT Treatment/Interventions ADLs/Self Care Home Management;Biofeedback;Balance training;Manual techniques;Therapeutic exercise;Therapeutic activities;Orthotic Fit/Training;Functional mobility training;Stair training;Gait training;Wheelchair mobility training;Patient/family education;DME Instruction;Neuromuscular re-education   PT Next Visit Plan  work on LLE NMR-do  this with brace off (really have to get in floor to maintain ankle alignment) (have  done NMES without good success)-could work on sit<>stand, reaching tasks, stepping up RLE (pt somewhat anxious about WB on LLE) also note he tends to "excuse" why he can't shift onto LLE or maintain posture-its just new for him.     Consulted and Agree with Plan of Care Patient;Family member/caregiver   Family Member Consulted mother      Patient will benefit from skilled therapeutic intervention in order to improve the following deficits and impairments:  Abnormal gait, Decreased endurance, Impaired flexibility, Decreased coordination, Decreased balance, Decreased mobility, Decreased skin integrity, Decreased knowledge of use of DME, Decreased strength, Impaired tone, Decreased range of motion, Hypomobility, Impaired perceived functional ability, Improper body mechanics, Postural dysfunction  Visit Diagnosis: Unsteadiness on feet  Other abnormalities of gait and mobility     Problem List Patient Active Problem List   Diagnosis Date Noted  . 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 Spinal cord infarction ([place]) 05/31/2013  . Food allergy 06/19/2012  . 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 Hypercoagulable state ([place]) 09/22/2011  . Neurogenic bladder 09/22/2011  . Neurogenic bowel 09/22/2011    Willow Ora, PTA, [facility] [address], [place], [place] [address] [phone] 09/15/16, 10:00 PM-  Name: Donald Harvey MRN: [id number] Date of Birth: April 16, 1991

## 2016-09-16 ENCOUNTER — Encounter: Payer: Self-pay | Admitting: Physical Therapy

## 2016-09-16 ENCOUNTER — Ambulatory Visit: Payer: BLUE CROSS/BLUE SHIELD | Admitting: Physical Therapy

## 2016-09-16 DIAGNOSIS — R2689 Other abnormalities of gait and mobility: Secondary | ICD-10-CM

## 2016-09-16 DIAGNOSIS — R2681 Unsteadiness on feet: Secondary | ICD-10-CM | POA: Diagnosis not present

## 2016-09-16 NOTE — Therapy (Signed)
[facility] [address] [place], [place], [address] Phone: [phone]   Fax:  [phone]  Physical Therapy Treatment  Patient Details  Name: Donald Harvey MRN: [id number] Date of Birth: May 28, 1991 Referring Provider: Alger Simons, MD  Encounter Date: 09/16/2016      PT End of Session - 09/16/16 1403    Visit Number 10   Number of Visits 16   Date for PT Re-Evaluation 09/29/16   Authorization Type BCBS-30 visit limit combo   Authorization - Visit Number 21   Authorization - Number of Visits 30   PT Start Time 1779   PT Stop Time 1400   PT Time Calculation (min) 38 min   Activity Tolerance Patient tolerated treatment well   Behavior During Therapy [facility] for tasks assessed/performed      Past Medical History:  Diagnosis Date  . Broken ankle   . Childhood asthma   . Embolism - blood clot   . Spinal paraplegia [facility])     Past Surgical History:  Procedure Laterality Date  . NO PAST SURGERIES      There were no vitals filed for this visit.      Subjective Assessment - 09/16/16 1322    Subjective Reports having some left ankle pain depending on position (if up/down alot pain comes faster if walking pain comes less fast) with brace on when walking on lateral aspect. Not hurting today, however his pressure sore/area on bottom on toe is hurting today.    Patient is accompained by: Family member   Pertinent History Hx of T6 SCI infarction   Limitations Walking;House hold activities   Patient Stated Goals "walking as best as I can"   Currently in Pain? Yes   Pain Score 3    Pain Location Toe (Comment which one)   Pain Orientation Left   Pain Descriptors / Indicators Aching;Sore   Pain Type Chronic pain   Pain Onset More than a month ago   Pain Frequency Intermittent                            Balance Exercises - 09/16/16 1349      Balance Exercises: Standing   Standing Eyes  Opened Narrow base of support (BOS);Head turns;Foam/compliant surface  progressed into stagger stance with left foot in back interm   Retro Gait 4 reps  In Tall kneel,   Sidestepping 4 reps  in Tall kneel   Sit to Stand Time Without left brace, with maual facilitation for left ankle and knee control. x5   Other Standing Exercises 1/2 kneel (left in supporting position)- attemping no UE support with UE raises, + alt UE rows with red Theraband.                                    PT Short Term Goals - 09/04/16 1912      PT SHORT TERM GOAL #1   Title Pt will initiate HEP in order to indicate improved functional mobility.  (Target Date: 08/28/16)   Baseline met    Time 4   Period Weeks   Status Achieved     PT SHORT TERM GOAL #2   Title Will assess gait speed in order to better understand fall risk.    Baseline gait speed goal no appropriate due to increased tone   Time 4  Period Weeks   Status Deferred           PT Long Term Goals - 07/31/16 1345      PT LONG TERM GOAL #1   Title Pt will be independent with HEP in order to indicate improved functional mobility and decreased fall risk.  (Target Date: 09/25/16)   Time 8   Period Weeks   Status New     PT LONG TERM GOAL #2   Title Pt will improve gait speed by .60 ft/sec from baseline w/ LRAD and AFO in order to indicate improved efficiency of gait and decreased fall risk.    Time 8   Period Weeks   Status New     PT LONG TERM GOAL #3   Title Pt will ambulate 300' over indoor surfaces with AFO and no AD at mod I level in order to indicate increased independence at home.     Time 8   Period Weeks   Status New     PT LONG TERM GOAL #4   Title Pt will ambulate 500' over paved outdoor surfaces w/ AFO and SPC at mod I level in order to indicate safe return to community and increased independence.     Time 8   Period Weeks   Status New     PT LONG TERM GOAL #5   Title Pt will verbalize making plans to go to job  fairs/interviews in hopes of returning to work.     Time 8   Period Weeks   Status New               Plan - 09/16/16 1608    Clinical Impression Statement Pt's balance was challenged with activities in 1/2 kneel position with LLE in back supporting position; this position helped pt work in LLE weight bearing and left hip extension.   Rehab Potential Good   Clinical Impairments Affecting Rehab Potential Hx of SCI infarct   PT Frequency 2x / week   PT Duration 8 weeks  Will likely only need 4 weeks, pending progress   PT Treatment/Interventions ADLs/Self Care Home Management;Biofeedback;Balance training;Manual techniques;Therapeutic exercise;Therapeutic activities;Orthotic Fit/Training;Functional mobility training;Stair training;Gait training;Wheelchair mobility training;Patient/family education;DME Instruction;Neuromuscular re-education   PT Next Visit Plan  work on LLE NMR-do this with brace off (really have to get in floor to maintain ankle alignment) (have done NMES without good success)-could work on sit<>stand, reaching tasks, stepping up RLE (pt somewhat anxious about WB on LLE) also note he tends to "excuse" why he can't shift onto LLE or maintain posture-its just new for him.     Consulted and Agree with Plan of Care Patient;Family member/caregiver   Family Member Consulted mother      Patient will benefit from skilled therapeutic intervention in order to improve the following deficits and impairments:  Abnormal gait, Decreased endurance, Impaired flexibility, Decreased coordination, Decreased balance, Decreased mobility, Decreased skin integrity, Decreased knowledge of use of DME, Decreased strength, Impaired tone, Decreased range of motion, Hypomobility, Impaired perceived functional ability, Improper body mechanics, Postural dysfunction  Visit Diagnosis: Unsteadiness on feet  Other abnormalities of gait and mobility     Problem List Patient Active Problem List    Diagnosis Date Noted  . 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 Spinal cord infarction ([place]) 05/31/2013  . Food allergy 06/19/2012  . 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 Hypercoagulable state ([place]) 09/22/2011  . Neurogenic bladder 09/22/2011  . Neurogenic bowel 09/22/2011    Bjorn Loser, PTA  09/16/16, 4:13 PM [facility] [address] [place], [place], [address] Phone: [phone]   Fax:  [phone]  Name: Donald Harvey MRN: [id number] Date of Birth: Sep 24, 1991

## 2016-09-30 ENCOUNTER — Ambulatory Visit: Payer: BLUE CROSS/BLUE SHIELD | Admitting: Physical Therapy

## 2016-09-30 ENCOUNTER — Encounter: Payer: Self-pay | Admitting: Physical Therapy

## 2016-09-30 DIAGNOSIS — R2681 Unsteadiness on feet: Secondary | ICD-10-CM | POA: Diagnosis not present

## 2016-09-30 DIAGNOSIS — R2689 Other abnormalities of gait and mobility: Secondary | ICD-10-CM

## 2016-09-30 NOTE — Patient Instructions (Signed)
Single Leg - Eyes Open    Holding support, lift right leg while maintaining balance over other leg. Progress to removing hands from support surface for longer periods of time. Hold__10__ seconds. Repeat __3__ times per session per leg. Do __1__ sessions per day.  Copyright  VHI. All rights reserved.  Feet Together, Varied Arm Positions - Eyes Closed    Stand with feet together and arms at your side. Close eyes and visualize upright position. Hold _30___ seconds. Repeat __3__ times per session. Do __1__ sessions per day.  Copyright  VHI. All rights reserved.  Marching in Place: Varied Surfaces    March in place, slowly lifting knees toward ceiling. Hold onto counter for support. Repeat __10__ times per session. Do __1__ sessions per day.  Copyright  VHI. All rights reserved.

## 2016-09-30 NOTE — Therapy (Signed)
[facility] [address] [place], [place], [address] Phone: [phone]   Fax:  [phone]  Physical Therapy Treatment  Patient Details  Name: Donald Harvey MRN: [id number] Date of Birth: 06/12/1991 Referring Provider: Alger Simons, MD  Encounter Date: 09/30/2016      PT End of Session - 09/30/16 1403    Visit Number 11   Number of Visits 16   Date for PT Re-Evaluation 09/29/16   Authorization Type [employer]-30 visit limit combo   Authorization - Visit Number 21   Authorization - Number of Visits 30   PT Start Time 1320   PT Stop Time 1400   PT Time Calculation (min) 40 min   Equipment Utilized During Treatment --  pt refused to wear gait belt   Activity Tolerance Patient tolerated treatment well   Behavior During Therapy [facility] for tasks assessed/performed      Past Medical History:  Diagnosis Date  . Broken ankle   . Childhood asthma   . Embolism - blood clot   . Spinal paraplegia [facility])     Past Surgical History:  Procedure Laterality Date  . NO PAST SURGERIES      There were no vitals filed for this visit.      Subjective Assessment - 09/30/16 1338    Subjective Pt performs PROM stretches his Left ankle   Patient is accompained by: Family member   Pertinent History Hx of T6 SCI infarction   Limitations Walking;House hold activities   Patient Stated Goals "walking as best as I can"   Currently in Pain? Yes   Pain Score 10-Worst pain ever   Pain Location Ankle   Pain Orientation Left   Pain Descriptors / Indicators Other (Comment)  "like when you roll your ankle"   Pain Type Chronic pain   Pain Onset More than a month ago   Pain Frequency Intermittent                         [facility] Adult PT Treatment/Exercise - 09/30/16 0001      Ambulation/Gait   Ambulation/Gait Yes   Ambulation/Gait Assistance 5: Supervision   Ambulation/Gait Assistance Details working on balance and  activity tolerance   Ambulation Distance (Feet) 115 Feet   Assistive device Straight cane  with tripod tip   Gait Pattern Step-through pattern;Decreased weight shift to left;Decreased dorsiflexion - left;Decreased stride length;Trunk flexed;Left hip hike;Poor foot clearance - left   Ambulation Surface Level;Indoor   Gait velocity 1.79 ft/sec     Knee/Hip Exercises: Aerobic   Other Aerobic Sci fit Level 4.0, 10 min, cues to increase speed > 30 RPMs  5/10 pain in ankle, reports increase pressure from brace.                PT Education - 09/30/16 1341    Education provided Yes   Education Details Pt understands HEP but pain in Left ankle keeps him from performing. Discussed Goals checked. Pt's mom removed the W/C from pt's room but pt still thinks it would be helpful due to left ankle pain.  Encouraged pt to follow up with orthotist on AFO issues.   Person(s) Educated Patient   Methods Explanation   Comprehension Verbalized understanding          PT Short Term Goals - 09/04/16 1912      PT SHORT TERM GOAL #1   Title Pt will initiate HEP in order to indicate improved  functional mobility.  (Target Date: 08/28/16)   Baseline met    Time 4   Period Weeks   Status Achieved     PT SHORT TERM GOAL #2   Title Will assess gait speed in order to better understand fall risk.    Baseline gait speed goal no appropriate due to increased tone   Time 4   Period Weeks   Status Deferred           PT Long Term Goals - 09/30/16 1612      PT LONG TERM GOAL #1   Title Pt will be independent with HEP in order to indicate improved functional mobility and decreased fall risk.  (Target Date: 09/25/16)   Baseline Pt understand HEP but does not perform due to pain in Left ankle when weight bearing, 09/30/16.   Time 8   Period Weeks   Status Partially Met     PT LONG TERM GOAL #2   Title Pt will improve gait speed by .60 ft/sec from baseline w/ LRAD and AFO in order to indicate  improved efficiency of gait and decreased fall risk.    Baseline baseline: 1.52f/sec.  09/30/16 1.721fsec with RW.   Time 8   Period Weeks   Status Not Met     PT LONG TERM GOAL #3   Title Pt will ambulate 300' over indoor surfaces with AFO and no AD at mod I level in order to indicate increased independence at home.     Baseline 09/30/16 unable walk this distance and requires RW for balance and due to L ankle pain.   Time 8   Period Weeks   Status Not Met     PT LONG TERM GOAL #4   Title Pt will ambulate 500' over paved outdoor surfaces w/ AFO and SPC at mod I level in order to indicate safe return to community and increased independence.     Baseline 09/30/16 Able to walk 115110fith AFO and SPC but required seated rest reporting L Ankle pain.   Time 8   Period Weeks   Status Not Met     PT LONG TERM GOAL #5   Title Pt will verbalize making plans to go to job fairs/interviews in hopes of returning to work.     Baseline 09/30/16 pt is not looking for a job feeling like he needs to "focus on one thing at a time".   Time 8   Period Weeks   Status Not Met               Plan - 09/30/16 1623    Clinical Impression Statement Pt did not meet any of his LTGs, please see explanations on each above.  Encourage pt again to contact orthotist if AFO is painful.  Pt would like to continue PT treatment.   Rehab Potential Good   Clinical Impairments Affecting Rehab Potential Hx of SCI infarct   PT Frequency 2x / week   PT Duration 8 weeks  Will likely only need 4 weeks, pending progress   PT Treatment/Interventions ADLs/Self Care Home Management;Biofeedback;Balance training;Manual techniques;Therapeutic exercise;Therapeutic activities;Orthotic Fit/Training;Functional mobility training;Stair training;Gait training;Wheelchair mobility training;Patient/family education;DME Instruction;Neuromuscular re-education   PT Next Visit Plan Renewal vs D/C   Consulted and Agree with Plan of Care  Patient;Family member/caregiver   Family Member Consulted mother      Patient will benefit from skilled therapeutic intervention in order to improve the following deficits and impairments:  Abnormal gait, Decreased endurance, Impaired flexibility,  Decreased coordination, Decreased balance, Decreased mobility, Decreased skin integrity, Decreased knowledge of use of DME, Decreased strength, Impaired tone, Decreased range of motion, Hypomobility, Impaired perceived functional ability, Improper body mechanics, Postural dysfunction  Visit Diagnosis: Unsteadiness on feet  Other abnormalities of gait and mobility     Problem List Patient Active Problem List   Diagnosis Date Noted  . 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 Food allergy 06/19/2012  . 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 Hypercoagulable state ([place]) 09/22/2011  . Neurogenic bladder 09/22/2011  . Neurogenic bowel 09/22/2011    Bjorn Loser, PTA  09/30/16, 4:34 PM [facility] [address] [place], [place], [address] Phone: [phone]   Fax:  [phone]  Name: CAEDAN SUMLER MRN: [id number] Date of Birth: 1991/03/07

## 2016-10-02 ENCOUNTER — Encounter: Payer: Self-pay | Admitting: Rehabilitation

## 2016-10-02 ENCOUNTER — Ambulatory Visit: Payer: BLUE CROSS/BLUE SHIELD | Attending: Physical Medicine & Rehabilitation | Admitting: Rehabilitation

## 2016-10-02 DIAGNOSIS — R2681 Unsteadiness on feet: Secondary | ICD-10-CM | POA: Diagnosis present

## 2016-10-02 DIAGNOSIS — G9511 Acute infarction of spinal cord (embolic) (nonembolic): Secondary | ICD-10-CM | POA: Insufficient documentation

## 2016-10-02 DIAGNOSIS — R2689 Other abnormalities of gait and mobility: Secondary | ICD-10-CM | POA: Diagnosis present

## 2016-10-02 NOTE — Therapy (Signed)
[facility] [address] [place], [place], [address] Phone: [phone]   Fax:  [phone]  Physical Therapy Treatment and D/C Summary  Patient Details  Name: Donald Harvey MRN: [id number] Date of Birth: 03-27-1991 Referring Provider: Alger Simons, MD  Encounter Date: 10/02/2016      PT End of Session - 10/02/16 1116    Visit Number 12   Number of Visits 16   Date for PT Re-Evaluation 09/29/16   Authorization Type [employer]-30 visit limit combo   Authorization - Visit Number 23   Authorization - Number of Visits 30   PT Start Time 1103  D/C didn't need whole time   PT Stop Time 1130   PT Time Calculation (min) 27 min   Equipment Utilized During Treatment --  pt refused to wear gait belt   Activity Tolerance Patient tolerated treatment well   Behavior During Therapy [facility] for tasks assessed/performed      Past Medical History:  Diagnosis Date  . Broken ankle   . Childhood asthma   . Embolism - blood clot   . Spinal paraplegia [facility])     Past Surgical History:  Procedure Laterality Date  . NO PAST SURGERIES      There were no vitals filed for this visit.      Subjective Assessment - 10/02/16 1252    Subjective Reports having some pain in L ankle today.  States that he has been walking at home.    Patient is accompained by: Family member   Pertinent History Hx of T6 SCI infarction   Limitations Walking;House hold activities   Patient Stated Goals "walking as best as I can"   Currently in Pain? Yes   Pain Score 5    Pain Location Ankle   Pain Orientation Left   Pain Descriptors / Indicators Other (Comment)  like when rolling ankle   Pain Type Chronic pain   Pain Onset More than a month ago   Pain Frequency Intermittent   Aggravating Factors  WB, walking   Pain Relieving Factors rest, removing brace                         [facility] Adult PT Treatment/Exercise - 10/02/16 0001      Ambulation/Gait   Ambulation/Gait Yes   Ambulation/Gait Assistance 5: Supervision   Ambulation/Gait Assistance Details Cues for slower gait speed in order to better increase time spent in L stance.  cues also for relaxed shoulders and decreased reliance of UEs.    Ambulation Distance (Feet) 115 Feet   Assistive device Rolling walker   Gait Pattern Step-through pattern;Decreased weight shift to left;Decreased dorsiflexion - left;Decreased stride length;Trunk flexed;Left hip hike;Poor foot clearance - left   Ambulation Surface Level;Indoor     Self-Care   Self-Care Other Self-Care Comments   Other Self-Care Comments  Had frank discussion with pt regarding lack of progress towards therapy goals.  Discussed that he needs to assume responsibility of modifying brace as needed in order to increase comfort.  He states that he has not "had time" to call [employer] yet.  Also continue to encourage him to perform exercises and increased walking at home as pain allows.  Continue to discuss pt getting out in community in order to look for job.  Pt states he has been holding off looking for jobs because of therapy.  PT discussed that many people attend therapy and also work jobs.  Pt seems  to have excuse for all deficits and lack of progress, therefore PT feels that D/C is necessary today in order to have pt work on things at home as well as getting brace modified as needed prior to any return to therapy.  Pt verbalized understanding.                  PT Education - 10/02/16 1259    Education provided Yes   Education Details see self care   Person(s) Educated Patient   Methods Explanation   Comprehension Verbalized understanding          PT Short Term Goals - 09/04/16 1912      PT SHORT TERM GOAL #1   Title Pt will initiate HEP in order to indicate improved functional mobility.  (Target Date: 08/28/16)   Baseline met    Time 4   Period Weeks   Status Achieved     PT SHORT TERM GOAL #2    Title Will assess gait speed in order to better understand fall risk.    Baseline gait speed goal no appropriate due to increased tone   Time 4   Period Weeks   Status Deferred           PT Long Term Goals - 10/02/16 1305      PT LONG TERM GOAL #1   Title Pt will be independent with HEP in order to indicate improved functional mobility and decreased fall risk.  (Target Date: 09/25/16)   Baseline Pt understand HEP but does not perform due to pain in Left ankle when weight bearing, 09/30/16.   Time 8   Period Weeks   Status Partially Met     PT LONG TERM GOAL #2   Title Pt will improve gait speed by .60 ft/sec from baseline w/ LRAD and AFO in order to indicate improved efficiency of gait and decreased fall risk.    Baseline baseline: 1.49f/sec.  09/30/16 1.778fsec with RW.   Time 8   Period Weeks   Status Not Met     PT LONG TERM GOAL #3   Title Pt will ambulate 300' over indoor surfaces with AFO and no AD at mod I level in order to indicate increased independence at home.     Baseline 09/30/16 unable walk this distance and requires RW for balance and due to L ankle pain.   Time 8   Period Weeks   Status Not Met     PT LONG TERM GOAL #4   Title Pt will ambulate 500' over paved outdoor surfaces w/ AFO and SPC at mod I level in order to indicate safe return to community and increased independence.     Baseline 09/30/16 Able to walk 11565fith AFO and SPC but required seated rest reporting L Ankle pain.   Time 8   Period Weeks   Status Not Met     PT LONG TERM GOAL #5   Title Pt will verbalize making plans to go to job fairs/interviews in hopes of returning to work.     Baseline 09/30/16 pt is not looking for a job feeling like he needs to "focus on one thing at a time".   Time 8   Period Weeks   Status Not Met               Plan - 10/02/16 1117    Clinical Impression Statement Session focused on frank discussion with pt regarding lack of progress in therapy and  things that are in pts control to correct in order to improve.  PT recommends D/C at this time to allow pt to work on his own.  Pt verbalized understanding.     Rehab Potential Good   Clinical Impairments Affecting Rehab Potential Hx of SCI infarct   PT Frequency 2x / week   PT Duration 8 weeks  Will likely only need 4 weeks, pending progress   PT Treatment/Interventions ADLs/Self Care Home Management;Biofeedback;Balance training;Manual techniques;Therapeutic exercise;Therapeutic activities;Orthotic Fit/Training;Functional mobility training;Stair training;Gait training;Wheelchair mobility training;Patient/family education;DME Instruction;Neuromuscular re-education   PT Next Visit Plan --   Consulted and Agree with Plan of Care Patient;Family member/caregiver   Family Member Consulted mother      Patient will benefit from skilled therapeutic intervention in order to improve the following deficits and impairments:  Abnormal gait, Decreased endurance, Impaired flexibility, Decreased coordination, Decreased balance, Decreased mobility, Decreased skin integrity, Decreased knowledge of use of DME, Decreased strength, Impaired tone, Decreased range of motion, Hypomobility, Impaired perceived functional ability, Improper body mechanics, Postural dysfunction  Visit Diagnosis: Unsteadiness on feet  Other abnormalities of gait and mobility  Spinal cord infarction ([place])    PHYSICAL THERAPY DISCHARGE SUMMARY  Visits from Start of Care: 12  Current functional level related to goals / functional outcomes: See LTGs   Remaining deficits: Pt continues to have significant L LE weakness related to spinal cord infarction.  Have provided max education, changed bracing, gotten static progressive splint for correction of structural and functional deficits, however pt continues to have limitations at L ankle.  Feel that he needs to continue to keep in contact with [employer] for brace modification and also improve  responsibility at home with compliance of HEP and walking.     Education / Equipment: HEP  Plan: Patient agrees to discharge.  Patient goals were not met. Patient is being discharged due to meeting the stated rehab goals.  ?????       Problem List Patient Active Problem List   Diagnosis Date Noted  . 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 Food allergy 06/19/2012  . 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 Hypercoagulable state ([place]) 09/22/2011  . Neurogenic bladder 09/22/2011  . Neurogenic bowel 09/22/2011    Cameron Sprang, PT, MPT [facility] [address] [place], [place], [address] Phone: [phone]   Fax:  [phone] 10/02/16, 1:09 PM  Name: Donald Harvey MRN: [id number] Date of Birth: October 27, 1991

## 2016-10-05 ENCOUNTER — Ambulatory Visit: Payer: BLUE CROSS/BLUE SHIELD | Admitting: Rehabilitation

## 2016-10-05 ENCOUNTER — Encounter: Payer: Self-pay | Admitting: *Deleted

## 2016-10-05 NOTE — Progress Notes (Signed)
Mailed signed completed DMV parking placard form to pt per pt request.

## 2016-10-09 ENCOUNTER — Ambulatory Visit: Payer: BLUE CROSS/BLUE SHIELD | Admitting: Rehabilitation

## 2016-10-12 ENCOUNTER — Ambulatory Visit: Payer: BLUE CROSS/BLUE SHIELD | Admitting: Rehabilitation

## 2016-10-16 ENCOUNTER — Ambulatory Visit: Payer: BLUE CROSS/BLUE SHIELD | Admitting: Rehabilitation

## 2016-10-28 ENCOUNTER — Ambulatory Visit (INDEPENDENT_AMBULATORY_CARE_PROVIDER_SITE_OTHER): Payer: BLUE CROSS/BLUE SHIELD | Admitting: Neurology

## 2016-10-28 ENCOUNTER — Telehealth: Payer: Self-pay | Admitting: Neurology

## 2016-10-28 ENCOUNTER — Encounter: Payer: Self-pay | Admitting: Neurology

## 2016-10-28 VITALS — BP 121/72 | HR 67 | Ht 74.0 in | Wt 197.2 lb

## 2016-10-28 DIAGNOSIS — G9511 Acute infarction of spinal cord (embolic) (nonembolic): Secondary | ICD-10-CM | POA: Diagnosis not present

## 2016-10-28 DIAGNOSIS — N319 Neuromuscular dysfunction of bladder, unspecified: Secondary | ICD-10-CM | POA: Diagnosis not present

## 2016-10-28 DIAGNOSIS — K592 Neurogenic bowel, not elsewhere classified: Secondary | ICD-10-CM | POA: Diagnosis not present

## 2016-10-28 DIAGNOSIS — G8220 Paraplegia, unspecified: Secondary | ICD-10-CM | POA: Diagnosis not present

## 2016-10-28 NOTE — Progress Notes (Signed)
PATIENT: Donald Harvey DOB: 1990/12/27  Chief Complaint  Patient presents with  . Spastic Paraplegia    He is here, with his mother Blondine, to discuss treatment with Botox.     HISTORICAL  Donald Harvey is a 25 years old right-handed male, seen in refer by Dr. 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 Initial evaluation was on October 28 2016.  I reviewed and summarized referring notes, his primary care physician is Dr. 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   MRI of the spine of July 11, 2011, the T2 hyperintense cord signal abnormality has increased in extent and signal intensity and now extends from T1 to T11.Given the interval increase in extent, the differential would include transverse myelitis and an atypical spinal cord infarct, raising the question of spinal cord vasculopathy or, given the dural sinus finding, spinal venous thrombosis.  A new low signal intensity lesion at the level of T7-T8 within the central/ventral spinal cord could represent a focus of hemorrhage.  He was initially treated with Coumadin, but he does not wish to  continue on Coumadin, is only taking aspirin now, he has received physical therapy, seeing Dr. Riley KillSwartz for management, has tried baclofen 10 mg 3 times a day with limited help, has received Botox injection in the past with no significant benefit.  He now ambulate with walker, have a tendency for left ankle plantarflexion,He does self bladder catheter, has a bowel regimen daily basis  REVIEW OF SYSTEMS: Full 14 system review of systems performed and notable only for as above   ALLERGIES: Allergies  Allergen Reactions  . Shellfish Allergy     HOME MEDICATIONS: Current Outpatient Prescriptions  Medication Sig Dispense Refill  . acetaminophen (TYLENOL) 500 MG tablet Take 1,000 mg by mouth as needed.    Marland Kitchen. aspirin EC 81 MG tablet Take 1 tablet (81 mg total) by mouth daily. 30 tablet 6  . baclofen (LIORESAL) 10 MG tablet Take 1 tablet (10 mg total) by mouth 3 (three) times daily. 90 each 11   No current facility-administered medications for this visit.     PAST MEDICAL HISTORY: Past Medical History:  Diagnosis Date  . Broken ankle   . Childhood asthma   . Embolism - blood clot   . Spinal paraplegia ([facility])     PAST SURGICAL HISTORY: Past Surgical History:  Procedure Laterality Date  . NO PAST SURGERIES      FAMILY HISTORY: No family history on file.  SOCIAL HISTORY:  Social History   Social History  . Marital status: Single    Spouse name: N/A  . Number of children: 0  . Years of education: 16.5    Occupational History  . Unemployed    Social History Main  Topics  . Smoking status: Never Smoker  . Smokeless tobacco: Never Used  . Alcohol use Yes     Comment: [place]  . Drug use: No  . Sexual activity: Not on file   Other Topics Concern  . Not on file   Social History Narrative   Lives w/ mother   Caffeine use: Tea/soda- couple times per month     PHYSICAL EXAM   Vitals:   10/28/16 1128  BP: 121/72  Pulse: 67  Weight: 197 lb 4 oz (89.5 kg)  Height: 6\' 2"   (1.88 m)    Not recorded      Body mass index is 25.33 kg/m.  PHYSICAL EXAMNIATION:  Gen: NAD, conversant, well nourised, obese, well groomed                     Cardiovascular: Regular rate rhythm, no peripheral edema, warm, nontender. Eyes: Conjunctivae clear without exudates or hemorrhage Neck: Supple, no carotid bruits. Pulmonary: Clear to auscultation bilaterally   NEUROLOGICAL EXAM:  MENTAL STATUS: Speech:    Speech is normal; fluent and spontaneous with normal comprehension.  Cognition:     Orientation to time, place and person     Normal recent and remote memory     Normal Attention span and concentration     Normal Language, naming, repeating,spontaneous speech     Fund of knowledge   CRANIAL NERVES: CN II: Visual fields are full to confrontation. Fundoscopic exam is normal with sharp discs and no vascular changes. Pupils are round equal and briskly reactive to light. CN III, IV, VI: extraocular movement are normal. No ptosis. CN V: Facial sensation is intact to pinprick in all 3 divisions bilaterally. Corneal responses are intact.  CN VII: Face is symmetric with normal eye closure and smile. CN VIII: Hearing is normal to rubbing fingers CN IX, X: Palate elevates symmetrically. Phonation is normal. CN XI: Head turning and shoulder shrug are intact CN XII: Tongue is midline with normal movements and no atrophy.  MOTOR: He has sustained left ankle clonus, left hip flexion 3, left knee flexion 2, left knee extension 3, left ankle dorsiflexion 1, left ankle plantarflexion 3, left Babinski signs, there was no significant weakness on the right leg   REFLEXES: Reflexes are 2+ and symmetric at the biceps, triceps, knees, and ankles. Plantar responses are flexor.  SENSORY:  decreased at bilateral lower extremity   COORDINATION: Rapid alternating movements and fine finger movements are intact. There is no dysmetria on finger-to-nose and heel-knee-shin.     GAIT/STANCE: He rely on his walker to get up from seated position, significant left ankle plantarflexion, work on left tiptoe, dragging his left leg    DIAGNOSTIC DATA (LABS, IMAGING, TESTING) - I reviewed patient records, labs, notes, testing and imaging myself where available.   ASSESSMENT AND PLAN  Donald Harvey is a 25 y.o. 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 Ph.D.  [facility] [address], [address] [place], [place] [address] Ph: [phone] Fax: [phone]  CC: Referring Provider

## 2016-10-28 NOTE — Telephone Encounter (Signed)
For Electrical stimulation guided xeomin injection Let him see Duwayne HeckDanielle first.  In 4 weeks per Dr Terrace ArabiaYan.

## 2016-11-10 NOTE — Telephone Encounter (Signed)
Called to schedule patient for injection, he did not answer so I left a VM asking him to call us back.

## 2016-11-10 NOTE — Telephone Encounter (Signed)
Pt's mother returned Danielle's call

## 2016-11-24 NOTE — Telephone Encounter (Signed)
Called and spoke with the patient, scheduled injection.

## 2017-01-04 ENCOUNTER — Ambulatory Visit: Payer: BLUE CROSS/BLUE SHIELD | Admitting: Neurology

## 2017-01-06 ENCOUNTER — Telehealth: Payer: Self-pay | Admitting: Neurology

## 2017-01-06 ENCOUNTER — Encounter: Payer: Self-pay | Admitting: Neurology

## 2017-01-06 ENCOUNTER — Encounter (INDEPENDENT_AMBULATORY_CARE_PROVIDER_SITE_OTHER): Payer: Self-pay

## 2017-01-06 ENCOUNTER — Ambulatory Visit (INDEPENDENT_AMBULATORY_CARE_PROVIDER_SITE_OTHER): Payer: BLUE CROSS/BLUE SHIELD | Admitting: Neurology

## 2017-01-06 VITALS — BP 135/78 | HR 86 | Ht 74.0 in | Wt 196.0 lb

## 2017-01-06 DIAGNOSIS — G8220 Paraplegia, unspecified: Secondary | ICD-10-CM

## 2017-01-06 NOTE — Progress Notes (Signed)
**  Xeomin 100 units, NDC [id number], Lot [id number], Exp 02/2019, specialty pharmacy.//mck,rn**

## 2017-01-06 NOTE — Progress Notes (Signed)
PATIENT: Donald MorrowJeremy E Glorioso DOB: Jul 24, 1991  No chief complaint on file.    HISTORICAL  Donald MorrowJeremy E Gehl is a 26 years old right-handed male, seen in refer by Dr. 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 Initial evaluation was on October 28 2016.  I reviewed and summarized referring notes, his primary care physician is Dr. 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   MRI of the spine of July 11, 2011, the T2 hyperintense cord signal abnormality has increased in extent and signal intensity and now extends from T1 to T11.Given the interval increase in extent, the differential would include transverse myelitis and an atypical spinal cord infarct, raising the question of spinal cord vasculopathy or, given the dural sinus finding, spinal venous thrombosis.  A new low signal intensity lesion at the level of T7-T8 within the central/ventral spinal cord could represent a focus of hemorrhage.  He was initially treated with Coumadin, but he does not wish to continue on Coumadin, is only taking aspirin now, he has received physical therapy, seeing Dr. Riley KillSwartz for  management, has tried baclofen 10 mg 3 times a day with limited help, has received Botox injection in the past with no significant benefit. He was injected with Botox by Dr. Riley KillSwartz in the past, denies significant improvement.  He now ambulate with walker, have a tendency for left ankle plantarflexion, He does self bladder catheter, has a bowel regimen daily basis  UPDATE January 06 2017: This is his first electrical stimulation guided botulism toxin a injection for spastic paraplegia, left worse than right, with forceful left ankle plantarflexion, frequent left leg muscle jumping  REVIEW OF SYSTEMS: Full 14 system review of systems performed and notable only for as above   ALLERGIES: Allergies  Allergen Reactions  . Shellfish Allergy     HOME MEDICATIONS: Current Outpatient Prescriptions  Medication Sig Dispense Refill  . acetaminophen (TYLENOL) 500 MG tablet Take 1,000 mg by mouth as needed.    Marland Kitchen. aspirin EC 81 MG tablet Take 1 tablet (81 mg total) by mouth daily. 30 tablet 6  . baclofen (LIORESAL) 10 MG tablet Take 1 tablet (10 mg total) by mouth 3 (three) times daily. 90 each 11   No current facility-administered medications for this visit.     PAST MEDICAL HISTORY: Past Medical History:  Diagnosis Date  . Broken ankle   . Childhood asthma   . Embolism - blood clot   . Spinal paraplegia (HCC)     PAST SURGICAL HISTORY: Past Surgical History:  Procedure Laterality Date  . NO PAST SURGERIES      FAMILY HISTORY: No family history on file.  SOCIAL HISTORY:  Social History   Social History  . Marital status: Single    Spouse name:  N/A  . Number of children: 0  . Years of education: 16.5    Occupational History  . Unemployed    Social History Main Topics  . Smoking status: Never Smoker  . Smokeless tobacco: Never Used  . Alcohol use Yes     Comment: [place]  . Drug use: No  . Sexual activity: Not on file   Other Topics Concern  . Not on file   Social History  Narrative   Lives w/ mother   Caffeine use: Tea/soda- couple times per month     PHYSICAL EXAM   There were no vitals filed for this visit.  Not recorded      There is no height or weight on file to calculate BMI.  PHYSICAL EXAMNIATION:  Gen: NAD, conversant, well nourised, obese, well groomed                     Cardiovascular: Regular rate rhythm, no peripheral edema, warm, nontender. Eyes: Conjunctivae clear without exudates or hemorrhage Neck: Supple, no carotid bruits. Pulmonary: Clear to auscultation bilaterally   NEUROLOGICAL EXAM:  MENTAL STATUS: Speech:    Speech is normal; fluent and spontaneous with normal comprehension.  Cognition:     Orientation to time, place and person     Normal recent and remote memory     Normal Attention span and concentration     Normal Language, naming, repeating,spontaneous speech     Fund of knowledge   CRANIAL NERVES: CN II: Visual fields are full to confrontation. Fundoscopic exam is normal with sharp discs and no vascular changes. Pupils are round equal and briskly reactive to light. CN III, IV, VI: extraocular movement are normal. No ptosis. CN V: Facial sensation is intact to pinprick in all 3 divisions bilaterally. Corneal responses are intact.  CN VII: Face is symmetric with normal eye closure and smile. CN VIII: Hearing is normal to rubbing fingers CN IX, X: Palate elevates symmetrically. Phonation is normal. CN XI: Head turning and shoulder shrug are intact CN XII: Tongue is midline with normal movements and no atrophy.  MOTOR: He has sustained left ankle clonus, left hip flexion 3, left knee flexion 2, left knee extension 3, left ankle dorsiflexion 1, left ankle plantarflexion 3, left Babinski signs, there was no significant weakness on the right leg   REFLEXES: Reflexes are 2+ and symmetric at the biceps, triceps, knees, and ankles. Plantar responses are flexor.  SENSORY:  decreased at bilateral lower extremity    COORDINATION: Rapid alternating movements and fine finger movements are intact. There is no dysmetria on finger-to-nose and heel-knee-shin.    GAIT/STANCE: He rely on his walker to get up from seated position, significant left ankle plantarflexion, walk on left tiptoe, dragging his left leg    DIAGNOSTIC DATA (LABS, IMAGING, TESTING) - I reviewed patient records, labs, notes, testing and imaging myself where available.   ASSESSMENT AND PLAN  GIANLUCAS EVENSON is a 26 y.o. 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 Ph.D.  [facility] [address] [place], [place] [address] Ph: [phone] Fax: [phone]  CC: Referring Provider

## 2017-01-06 NOTE — Telephone Encounter (Signed)
[facility] [phone] called to advise Donald QuamXEOMIN will ship tomorrow and will arrive by noon.  FYI

## 2017-01-06 NOTE — Telephone Encounter (Signed)
Dr Terrace ArabiaYan request 3 month Xeomin scheduled

## 2017-01-07 NOTE — Telephone Encounter (Signed)
Noted, medication has been received.  °

## 2017-01-13 NOTE — Telephone Encounter (Signed)
Scheduled patient for Xeomin injection with Dr. Terrace ArabiaYan on 04-28-17 @ 3:30pm.

## 2017-01-13 NOTE — Telephone Encounter (Signed)
I called to schedule the patient for his next injection, he did not answer so I left a VM asking him to call me back.

## 2017-01-14 NOTE — Telephone Encounter (Signed)
Noted, thank you

## 2017-04-28 ENCOUNTER — Ambulatory Visit: Payer: BLUE CROSS/BLUE SHIELD | Admitting: Neurology

## 2017-05-26 ENCOUNTER — Telehealth: Payer: Self-pay | Admitting: Neurology

## 2017-05-26 NOTE — Telephone Encounter (Signed)
I called to inform the patient we had to cancel his apt for tomorrow 07/24 due to a lack of insurance coverage. The patient did not answer so I left a VM asking him to call me back.

## 2017-05-27 ENCOUNTER — Ambulatory Visit: Payer: BLUE CROSS/BLUE SHIELD | Admitting: Neurology

## 2017-08-24 DIAGNOSIS — Z0271 Encounter for disability determination: Secondary | ICD-10-CM

## 2018-11-14 ENCOUNTER — Ambulatory Visit: Payer: BLUE CROSS/BLUE SHIELD | Admitting: Physical Therapy

## 2019-04-13 ENCOUNTER — Emergency Department (HOSPITAL_COMMUNITY): Payer: Medicaid Other

## 2019-04-13 ENCOUNTER — Emergency Department (HOSPITAL_COMMUNITY)
Admission: EM | Admit: 2019-04-13 | Discharge: 2019-04-14 | Disposition: A | Discharge status: Home / Self Care | Payer: Medicaid Other | Attending: Emergency Medicine | Admitting: Emergency Medicine

## 2019-04-13 ENCOUNTER — Encounter (HOSPITAL_COMMUNITY): Payer: Self-pay

## 2019-04-13 ENCOUNTER — Other Ambulatory Visit: Payer: Self-pay

## 2019-04-13 DIAGNOSIS — U071 COVID-19: Secondary | ICD-10-CM | POA: Diagnosis not present

## 2019-04-13 DIAGNOSIS — R509 Fever, unspecified: Secondary | ICD-10-CM | POA: Insufficient documentation

## 2019-04-13 DIAGNOSIS — N492 Inflammatory disorders of scrotum: Secondary | ICD-10-CM | POA: Diagnosis present

## 2019-04-13 DIAGNOSIS — J988 Other specified respiratory disorders: Secondary | ICD-10-CM | POA: Insufficient documentation

## 2019-04-13 DIAGNOSIS — N451 Epididymitis: Secondary | ICD-10-CM | POA: Diagnosis not present

## 2019-04-13 LAB — CBC WITH DIFFERENTIAL/PLATELET
Abs Immature Granulocytes: 0.1 10*3/uL — ABNORMAL HIGH (ref 0.00–0.07)
Basophils Absolute: 0 10*3/uL (ref 0.0–0.1)
Basophils Relative: 0 %
Eosinophils Absolute: 0 10*3/uL (ref 0.0–0.5)
Eosinophils Relative: 0 %
HCT: 48.9 % (ref 39.0–52.0)
Hemoglobin: 16.8 g/dL (ref 13.0–17.0)
Immature Granulocytes: 1 %
Lymphocytes Relative: 4 %
Lymphs Abs: 0.6 10*3/uL — ABNORMAL LOW (ref 0.7–4.0)
MCH: 31.3 pg (ref 26.0–34.0)
MCHC: 34.4 g/dL (ref 30.0–36.0)
MCV: 91.1 fL (ref 80.0–100.0)
Monocytes Absolute: 0.9 10*3/uL (ref 0.1–1.0)
Monocytes Relative: 6 %
Neutro Abs: 13.5 10*3/uL — ABNORMAL HIGH (ref 1.7–7.7)
Neutrophils Relative %: 89 %
Platelets: 213 10*3/uL (ref 150–400)
RBC: 5.37 MIL/uL (ref 4.22–5.81)
RDW: 13 % (ref 11.5–15.5)
WBC: 15.1 10*3/uL — ABNORMAL HIGH (ref 4.0–10.5)
nRBC: 0 % (ref 0.0–0.2)

## 2019-04-13 LAB — URINALYSIS, ROUTINE W REFLEX MICROSCOPIC
Bilirubin Urine: NEGATIVE
Glucose, UA: NEGATIVE mg/dL
Ketones, ur: 20 mg/dL — AB
Nitrite: NEGATIVE
Protein, ur: 100 mg/dL — AB
Specific Gravity, Urine: 1.028 (ref 1.005–1.030)
WBC, UA: >50 WBC/hpf — ABNORMAL HIGH (ref 0–5)
pH: 5 (ref 5.0–8.0)

## 2019-04-13 LAB — BASIC METABOLIC PANEL
Anion gap: 15 (ref 5–15)
BUN: 13 mg/dL (ref 6–20)
CO2: 20 mmol/L — ABNORMAL LOW (ref 22–32)
Calcium: 9.1 mg/dL (ref 8.9–10.3)
Chloride: 100 mmol/L (ref 98–111)
Creatinine, Ser: 1.44 mg/dL — ABNORMAL HIGH (ref 0.61–1.24)
GFR calc Af Amer: >60 mL/min (ref >60)
GFR calc non Af Amer: >60 mL/min (ref >60)
Glucose, Bld: 108 mg/dL — ABNORMAL HIGH (ref 70–99)
Potassium: 3.4 mmol/L — ABNORMAL LOW (ref 3.5–5.1)
Sodium: 135 mmol/L (ref 135–145)

## 2019-04-13 LAB — LACTIC ACID, PLASMA: Lactic Acid, Venous: 1.5 mmol/L (ref 0.5–1.9)

## 2019-04-13 LAB — SARS CORONAVIRUS 2 BY RT PCR (HOSPITAL ORDER, PERFORMED IN ~~LOC~~ HOSPITAL LAB): SARS Coronavirus 2: POSITIVE — AB

## 2019-04-13 MED ORDER — SODIUM CHLORIDE 0.9 % IV BOLUS
1000.0000 mL | Freq: Once | INTRAVENOUS | Status: AC
  Administered 2019-04-13: 1000 mL via INTRAVENOUS

## 2019-04-13 MED ORDER — ACETAMINOPHEN 325 MG PO TABS
650.0000 mg | ORAL_TABLET | Freq: Once | ORAL | Status: AC
  Administered 2019-04-13: 650 mg via ORAL
  Filled 2019-04-13: qty 2

## 2019-04-13 MED ORDER — ONDANSETRON HCL 4 MG/2ML IJ SOLN
4.0000 mg | Freq: Once | INTRAMUSCULAR | Status: AC
  Administered 2019-04-13: 4 mg via INTRAVENOUS
  Filled 2019-04-13: qty 2

## 2019-04-13 MED ORDER — DOXYCYCLINE HYCLATE 100 MG PO CAPS: 100.0000 mg | ORAL_CAPSULE | Freq: Two times a day (BID) | ORAL | 0 refills | Status: DC

## 2019-04-13 MED ORDER — SODIUM CHLORIDE 0.9 % IV SOLN
1.0000 g | Freq: Once | INTRAVENOUS | Status: AC
  Administered 2019-04-13: 1 g via INTRAVENOUS
  Filled 2019-04-13: qty 10

## 2019-04-13 MED ORDER — LEVOFLOXACIN 500 MG PO TABS: 500.0000 mg | ORAL_TABLET | Freq: Every day | ORAL | 0 refills | Status: DC

## 2019-04-13 MED ORDER — IBUPROFEN 200 MG PO TABS
400.0000 mg | ORAL_TABLET | Freq: Once | ORAL | Status: AC
  Administered 2019-04-13: 400 mg via ORAL
  Filled 2019-04-13: qty 2

## 2019-04-13 MED ORDER — ACETAMINOPHEN 500 MG PO TABS
1000.0000 mg | ORAL_TABLET | Freq: Once | ORAL | Status: AC
  Administered 2019-04-13: 1000 mg via ORAL
  Filled 2019-04-13: qty 2

## 2019-04-13 NOTE — ED Notes (Signed)
Pt attempted to void but was unable to at this time 

## 2019-04-13 NOTE — ED Notes (Signed)
Notified RN Lovena Le of pt's temp and HR. Pt had emesis episode with this NT at bedside. 25-55mL of vomit noted in bag.

## 2019-04-13 NOTE — ED Notes (Addendum)
Notified MD about patient's temperature and heart rate. Patient states that he feels nauseous intermittently, but feels better after vomiting. Patient provided urinal for urine sample. Will continue to monitor patient.

## 2019-04-13 NOTE — Discharge Instructions (Signed)
You were seen in the emergency department for testicular swelling and a high fever.  You tested positive for Covid.  You received some IV fluids and antibiotics here.  Your ultrasound did not show any obvious abscess but did show some inflammation around your testicle and epididymis.  We are sending you home with some antibiotics to help treat this.  It will be important for you to keep your fever down.  Please contact your urologist at [facility] for follow-up.  Return if any worsening symptoms.

## 2019-04-13 NOTE — ED Triage Notes (Signed)
Patient noticed swelling to his right testicle on Tuesday. The swelling went down some on Wednesday but, today has increased.   2/10 pain unless it is touched.    A/Ox4 Wheelchair in triage

## 2019-04-13 NOTE — ED Provider Notes (Signed)
Patient care signed out to me at end of shift by Dr. Aletta Edouard.  H/o paraplegia, self caths for urine. Can walk with brace and walker. Lives with family. Has right testicular swelling - h/o same, intermittent Febrile tonight to 103 UA - indeterminate for infection given routine catheterizations. US shows evidence of epididymitis. Started on Levaquin. COVID+ CXR neg - no hypoxia   Plan: observe for improvement to determine adm vs d/ch. Fever has been difficult to control. He is tachycardic. IVF's provided.   Re-evaluation: fever reduced to 98.8. His heart rate improves to 107. Discussed admission vs discharge home. The patient prefers to be discharged to home. He is felt reliable to return with any worsening symptoms.   He understands isolation requirements of being COVID+, and symptoms that would warrant return to the hospital.   He is discharged home per plan of previous provider.    Charlann Lange, PA-C 04/15/19 6203    Hayden Rasmussen, MD 04/17/19 [phone]

## 2019-04-13 NOTE — ED Notes (Signed)
Ultrasound at bedside. Patient states that he does not believe he will be able to use a urinal because "urine leaks out as if I have a UTI" but will call out for assistance if he gets the urge to void.

## 2019-04-13 NOTE — ED Notes (Signed)
US finished at bedside.

## 2019-04-13 NOTE — ED Notes (Signed)
US at bedside

## 2019-04-13 NOTE — ED Notes (Signed)
Patient provided water and gingerale to drink. Patient sweating profusely, but states that he is feeling much better. Denies any pain at this time. Will continue to monitor.

## 2019-04-13 NOTE — ED Provider Notes (Signed)
[facility]-EMERGENCY DEPT Provider Note   CSN: [id number] Arrival date & time: 04/13/19  1756     History   Chief Complaint Chief Complaint  Patient presents with  . Groin Swelling    HPI Donald Harvey is a 28 y.o. male.  He is a prior history of a spinal clot that left him with some paraplegia.  He continues to self cath.  He said for the last 2 or 3 days he has noticed some swelling around his right testicle that he has had before but recurred again today along with a fever.  He has not noticed any urinary symptoms.  He thinks the fever is from getting some allergy shots a few days ago.  No particular cough no shortness of breath no abdominal pain nausea vomiting or diarrhea.  He rates the pain as a 0 out of 10 at rest and a 1 out of 10 with palpation.  He has seen a urologist for this in the past at [facility] and was told he had some fluid pockets there that grow sometimes.     The history is provided by the patient.  Male GU Problem Presenting symptoms: scrotal pain   Presenting symptoms: no dysuria   Context: spontaneously   Relieved by:  Rest Worsened by:  Tactile pressure Ineffective treatments:  Cold compresses Associated symptoms: fever and scrotal swelling   Associated symptoms: no abdominal pain, no genital lesions, no genital rash, no hematuria, no nausea and no vomiting     Past Medical History:  Diagnosis Date  . Broken ankle   . Childhood asthma   . Embolism - blood clot   . Spinal paraplegia [facility](HCC)     Patient Active Problem List   Diagnosis Date Noted  . 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 Food allergy 06/19/2012  . 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 Hypercoagulable state (HCC) 09/22/2011  . Neurogenic bladder 09/22/2011  . Neurogenic bowel 09/22/2011    Past Surgical History:  Procedure Laterality Date  . NO PAST SURGERIES          Home Medications    Prior to  Admission medications   Medication Sig Start Date End Date Taking? Authorizing Provider  acetaminophen (TYLENOL) 500 MG tablet Take 1,000 mg by mouth as needed.    [provider]  aspirin EC 81 MG tablet Take 1 tablet (81 mg total) by mouth daily. 09/04/16   Anson FretAhern, Antonia B, MD  baclofen (LIORESAL) 10 MG tablet Take 1 tablet (10 mg total) by mouth 3 (three) times daily. 09/04/16   Anson FretAhern, Antonia B, MD  incobotulinumtoxinA (XEOMIN) 100 units SOLR injection Inject 400 Units into the muscle every 3 (three) months.    [provider]    Family History No family history on file.  Social History Social History   Tobacco Use  . Smoking status: Never Smoker  . Smokeless tobacco: Never Used  Substance Use Topics  . Alcohol use: Yes    Comment: Ocass  . Drug use: No     Allergies   Shellfish allergy   Review of Systems Review of Systems  Constitutional: Positive for fever. Negative for chills.  HENT: Negative for sore throat.   Eyes: Negative for visual disturbance.  Respiratory: Negative for shortness of breath.   Cardiovascular: Negative for chest pain.  Gastrointestinal: Negative for abdominal pain, nausea and vomiting.  Genitourinary: Positive for scrotal swelling. Negative for dysuria and hematuria.  Musculoskeletal: Negative for neck pain.  Skin: Negative for rash.  Neurological: Positive for weakness and numbness. Negative for headaches.     Physical Exam Updated Vital Signs BP 112/67 (BP Location: Left Arm)   Pulse (!) 119   Temp (!) 103.2 F (39.6 C) (Oral)   Resp (!) 22   SpO2 100%   Physical Exam Vitals signs and nursing note reviewed.  Constitutional:      Appearance: He is well-developed. He is not ill-appearing or toxic-appearing.  HENT:     Head: Normocephalic and atraumatic.  Eyes:     Conjunctiva/sclera: Conjunctivae normal.  Neck:     Musculoskeletal: Neck supple.  Cardiovascular:     Rate and Rhythm: Regular rhythm. Tachycardia  present.     Heart sounds: No murmur.  Pulmonary:     Effort: Pulmonary effort is normal. No respiratory distress.     Breath sounds: Normal breath sounds.  Abdominal:     Palpations: Abdomen is soft.     Tenderness: There is no abdominal tenderness.  Genitourinary:    Penis: Normal and circumcised.      Scrotum/Testes:        Right: Mass, tenderness and swelling present.        Left: Tenderness not present.  Musculoskeletal:     Right lower leg: No edema.     Left lower leg: No edema.  Skin:    General: Skin is warm and dry.     Capillary Refill: Capillary refill takes less than 2 seconds.  Neurological:     Mental Status: He is alert. Mental status is at baseline.      ED Treatments / Results  Labs (all labs ordered are listed, but only abnormal results are displayed) Labs Reviewed  SARS CORONAVIRUS 2 ([facility]) - Abnormal; Notable for the following components:      Result Value   SARS Coronavirus 2 POSITIVE (*)    All other components within normal limits  BASIC METABOLIC PANEL - Abnormal; Notable for the following components:   Potassium 3.4 (*)    CO2 20 (*)    Glucose, Bld 108 (*)    Creatinine, Ser 1.44 (*)    All other components within normal limits  CBC WITH DIFFERENTIAL/PLATELET - Abnormal; Notable for the following components:   WBC 15.1 (*)    Neutro Abs 13.5 (*)    Lymphs Abs 0.6 (*)    Abs Immature Granulocytes 0.10 (*)    All other components within normal limits  URINALYSIS, ROUTINE W REFLEX MICROSCOPIC - Abnormal; Notable for the following components:   Color, Urine AMBER (*)    APPearance CLOUDY (*)    Hgb urine dipstick MODERATE (*)    Ketones, ur 20 (*)    Protein, ur 100 (*)    Leukocytes,Ua LARGE (*)    WBC, UA >50 (*)    Bacteria, UA MANY (*)    All other components within normal limits  CULTURE, BLOOD (ROUTINE X 2)  CULTURE, BLOOD (ROUTINE X 2)  URINE CULTURE  LACTIC ACID, PLASMA    EKG     Radiology Dg Chest Port 1 View  Result Date: 04/13/2019 CLINICAL DATA:  COVID-19 positive EXAM: PORTABLE CHEST 1 VIEW COMPARISON:  None. FINDINGS: The heart size and mediastinal contours are within normal limits. Both lungs are clear. The visualized skeletal structures are unremarkable. IMPRESSION: No active disease. Electronically Signed   By: Donavan Foil M.D.   On: 04/13/2019 21:59  [place] Scrotum W/doppler  Result Date: 04/13/2019 CLINICAL DATA:  Right testicular swelling EXAM: SCROTAL ULTRASOUND DOPPLER ULTRASOUND OF THE TESTICLES TECHNIQUE: Complete ultrasound examination of the testicles, epididymis, and other scrotal structures was performed. Color and spectral Doppler ultrasound were also utilized to evaluate blood flow to the testicles. COMPARISON:  None. FINDINGS: Right testicle Measurements: 4.8 x 3.2 x 3.2 cm. Heterogenous echotexture but without mass lesion. Slightly hyperemic compared to the left testis. Left testicle Measurements: 4.7 x 2.6 x 3 cm. No mass or microlithiasis visualized. Right epididymis: Enlarged heterogenous epididymis. Slightly hyperemic Left epididymis:  Normal in size and appearance. Hydrocele:  Small complex right hydrocele. Varicocele:  None visualized. Pulsed Doppler interrogation of both testes demonstrates normal low resistance arterial and venous waveforms bilaterally. IMPRESSION: 1. Enlarged heterogeneous right epididymis with slight increased vascularity suggesting epididymitis. The right testis is heterogeneous in echotexture and also slightly hypervascular suggesting orchitis. 2. Small complex right hydrocele Electronically Signed   By: Jasmine PangKim  Fujinaga M.D.   On: 04/13/2019 20:28    Procedures Procedures (including critical care time)  Medications Ordered in ED Medications  acetaminophen (TYLENOL) tablet 650 mg (650 mg Oral Given 04/13/19 1959)  sodium chloride 0.9 % bolus 1,000 mL (0 mLs Intravenous Stopped 04/13/19 2119)  cefTRIAXone (ROCEPHIN) 1 g in  sodium chloride 0.9 % 100 mL IVPB (0 g Intravenous Stopped 04/13/19 2239)  ibuprofen (ADVIL) tablet 400 mg (400 mg Oral Given 04/13/19 2205)  sodium chloride 0.9 % bolus 1,000 mL (0 mLs Intravenous Stopped 04/13/19 2328)  ondansetron (ZOFRAN) injection 4 mg (4 mg Intravenous Given 04/13/19 2205)  sodium chloride 0.9 % bolus 1,000 mL (0 mLs Intravenous Stopped 04/14/19 0059)  acetaminophen (TYLENOL) tablet 1,000 mg (1,000 mg Oral Given 04/13/19 2340)     Initial Impression / Assessment and Plan / ED Course  I have reviewed the triage vital signs and the nursing notes.  Pertinent labs & imaging results that were available during my care of the patient were reviewed by me and considered in my medical decision making (see chart for details).  Clinical Course as of Apr 14 747  Thu Apr 13, 2019  [id number] 28 year old with some paraplegia from spinal cord injury here with scrotal swelling for a few days on and off and now a fever to 103.2.  Differential diagnosis includes UTI, pneumonia, intra-abdominal process, scrotal abscess, epididymitis, Fournier's gangrene.  He is a tender fullness in the right hemiscrotum with no overlying skin lesions.  The tissues through the perineum are all soft without any crepitus or tenderness.   [MB]  2106 Patient's labs showing an elevated white count and a slight elevation in his creatinine.  Lactic acid is normal.  Testicular ultrasound showed a small complex hydrocele and some epididymal and testicular swelling consistent with epididymitis and orchitis.   [MB]  2220 Patient Covid positive.  Chest x-ray does not show any pneumonias.  Not hypoxic.  Still working on getting his temperature down but if he is feeling well enough he possibly can be discharged.  I have ordered him outpatient antibiotics of Levaquin to treat his epididymoorchitis.  Urinalysis also possibly infected although could be chronic colonization due to catheterizations.  Patient states is not sexually active.    [MB]    Clinical Course User Index [MB] Terrilee FilesButler, Shakaria Raphael C, MD        Final Clinical Impressions(s) / ED Diagnoses   Final diagnoses:  COVID-19  Epididymitis    ED Discharge Orders  Ordered    doxycycline (VIBRAMYCIN) 100 MG capsule  2 times daily,   Status:  Discontinued     04/13/19 2157    levofloxacin (LEVAQUIN) 500 MG tablet  Daily     04/13/19 2220           Terrilee FilesButler, Chancy Smigiel C, MD 04/14/19 [phone]

## 2019-04-14 NOTE — ED Notes (Signed)
Discharge instructions reviewed with patient. Opportunity for questions and concerns provided. Patient alert and stable upon discharge.

## 2019-04-16 LAB — URINE CULTURE: Culture: >=100000 — AB

## 2019-04-17 ENCOUNTER — Telehealth: Payer: Self-pay | Admitting: Emergency Medicine

## 2019-04-17 NOTE — Telephone Encounter (Signed)
Post ED Visit - Positive Culture Follow-up  Culture report reviewed by antimicrobial stewardship pharmacist: [place] Team []  Elenor Quinones, Pharm.D. []  Heide Guile, Pharm.D., BCPS AQ-ID []  Parks Neptune, Pharm.D., BCPS []  Alycia Rossetti, Pharm.D., BCPS []  [place], [place].D., BCPS, AAHIVP []  Legrand Como, Pharm.D., BCPS, AAHIVP []  Salome Arnt, PharmD, BCPS []  Johnnette Gourd, PharmD, BCPS []  Hughes Better, PharmD, BCPS []  Leeroy Cha, PharmD []  Laqueta Linden, PharmD, BCPS []  Albertina Parr, PharmD  [place] Team []  Leodis Sias, PharmD []  Lindell Spar, PharmD []  Royetta Asal, PharmD []  Graylin Shiver, Rph []  Rema Fendt) Glennon Mac, PharmD []  Arlyn Dunning, PharmD []  Netta Cedars, PharmD []  Dia Sitter, PharmD []  Leone Haven, PharmD []  Gretta Arab, PharmD [x]  Theodis Shove, PharmD []  Peggyann Juba, PharmD []  Reuel Boom, PharmD   Positive urine culture Treated with doxycycline and levofloxacin, organism sensitive to the same and no further patient follow-up is required at this time.  Hazle Nordmann 04/17/2019, 1:07 PM

## 2019-04-18 LAB — CULTURE, BLOOD (ROUTINE X 2)
Culture: NO GROWTH
Culture: NO GROWTH
Special Requests: ADEQUATE

## 2020-02-16 IMAGING — US ULTRASOUND SCROTUM DOPPLER COMPLETE
1 series · 14 of 25 positions shown · non-contrast
Comparison: None.

CLINICAL DATA: Right testicular swelling

EXAM:
SCROTAL ULTRASOUND
DOPPLER ULTRASOUND OF THE TESTICLES
TECHNIQUE: Complete ultrasound examination of the testicles, epididymis, and
other scrotal structures was performed. Color and spectral Doppler
ultrasound were also utilized to evaluate blood flow to the
testicles.

[Series 1: ultrasound scrotum doppler complete · 14 of 38 slices shown]
[im 1/38]
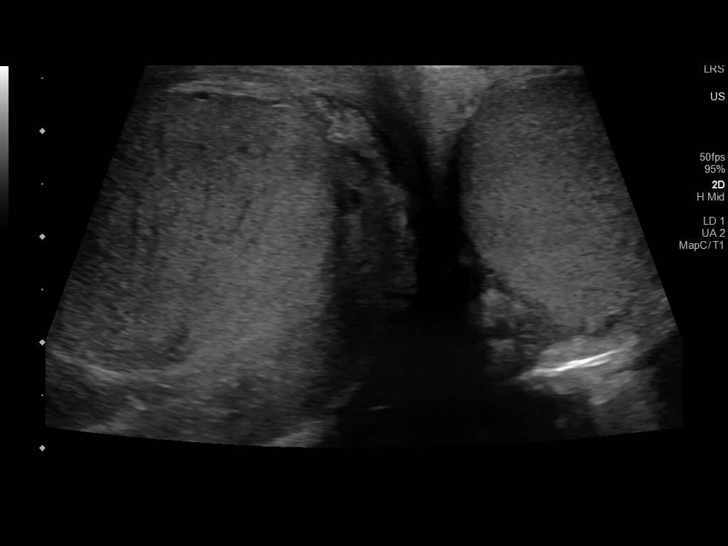
[im 4/38]
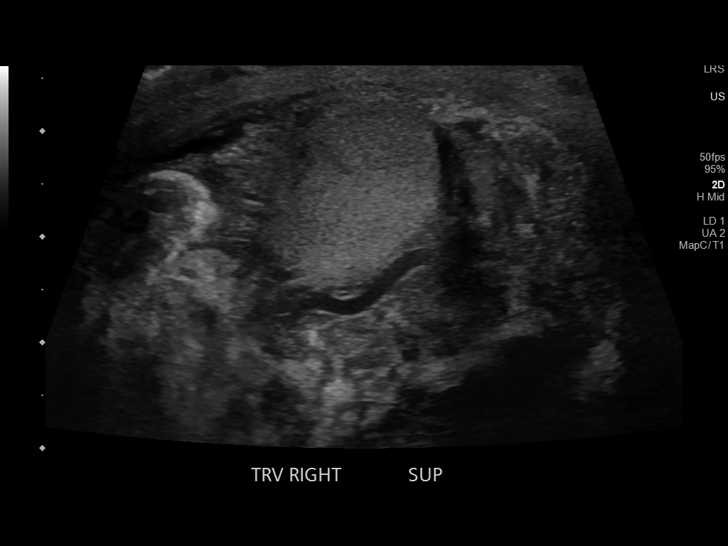
[im 7/38]
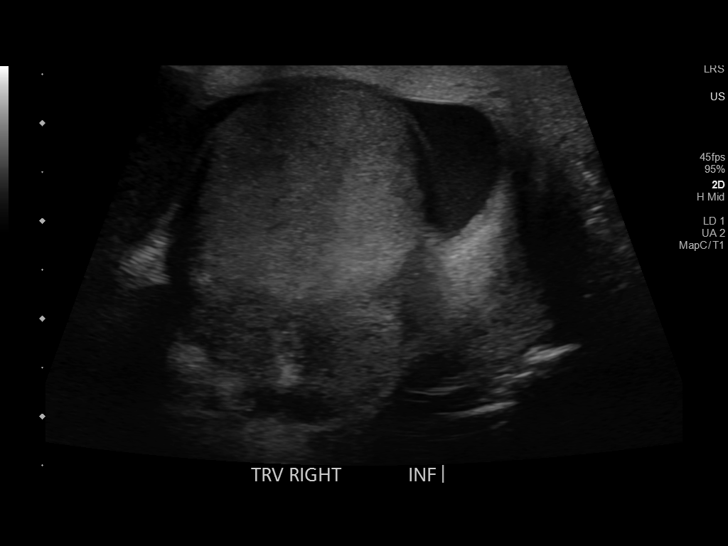
[im 10/38]
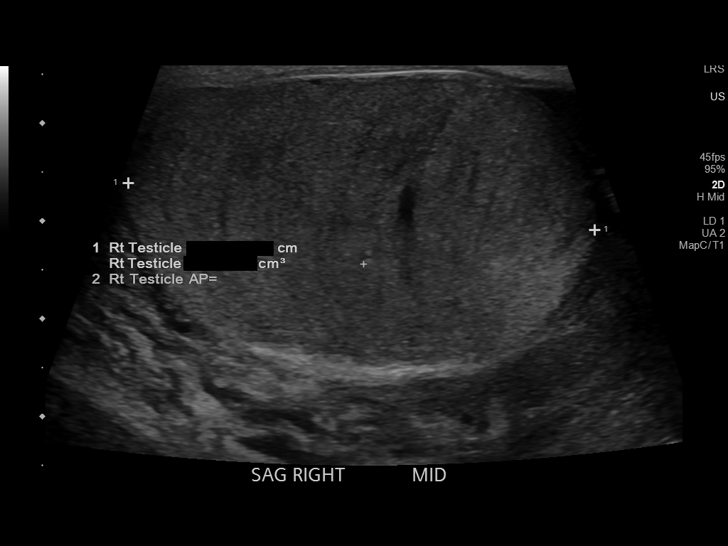
[im 13/38]
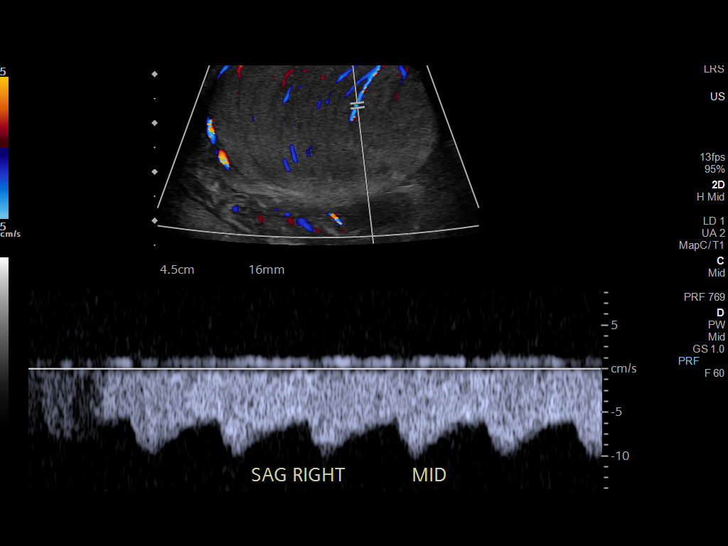
[im 14/38]
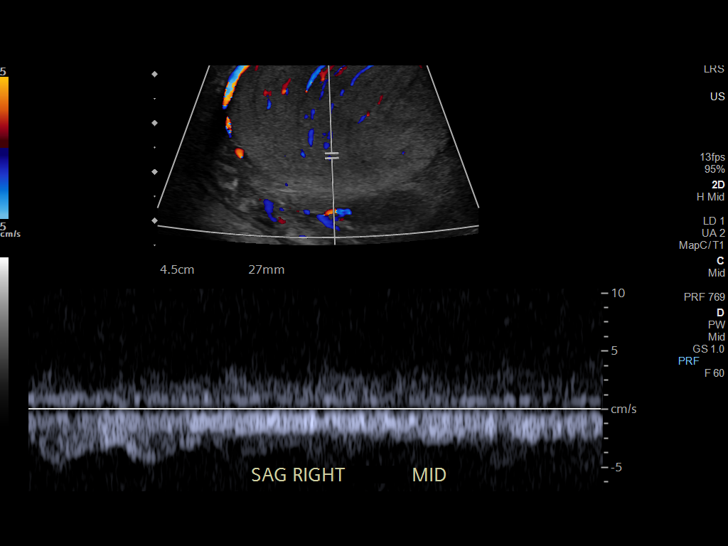
[im 17/38]
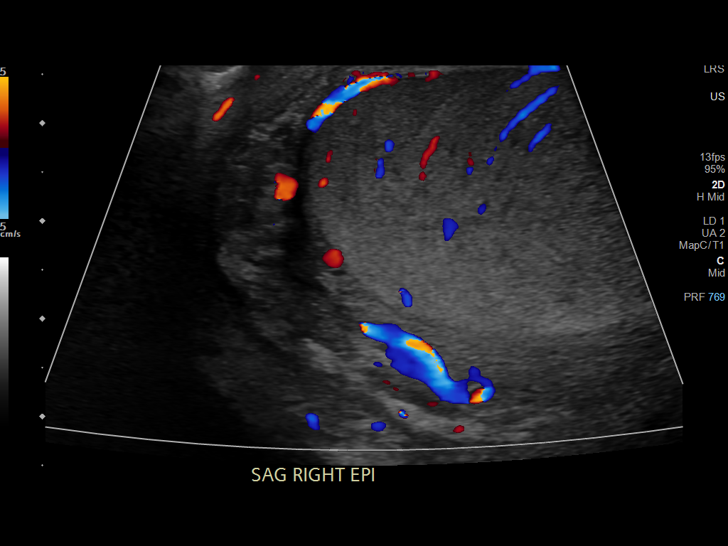
[im 21/38]
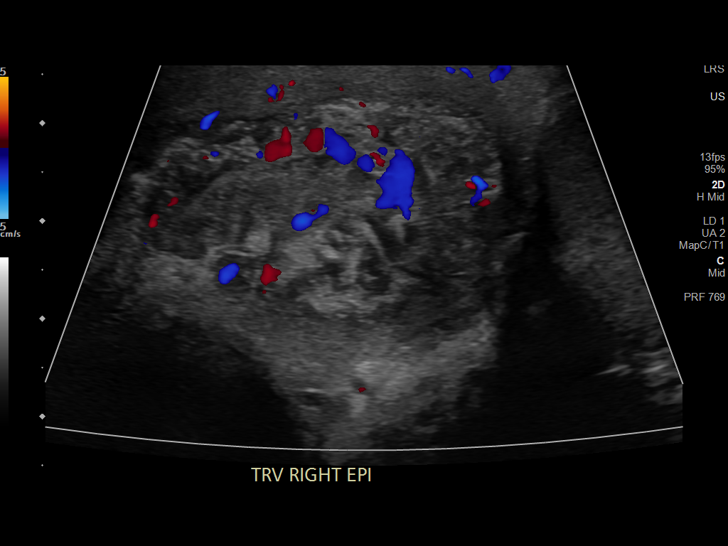
[im 24/38]
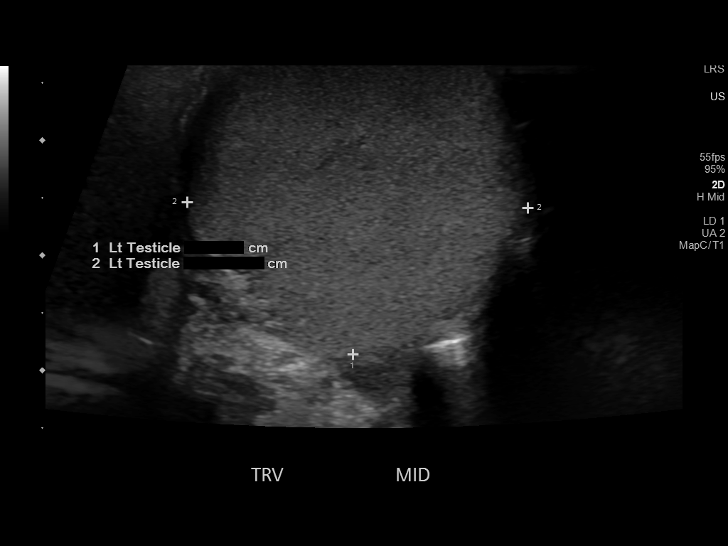
[im 25/38]
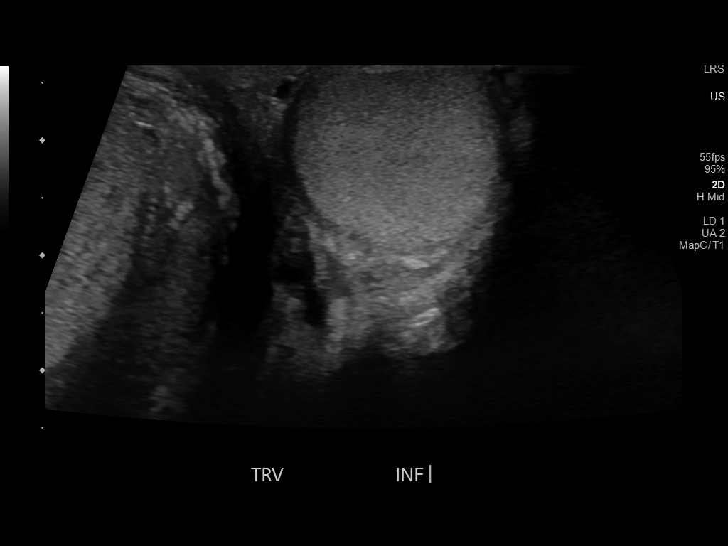
[im 28/38]
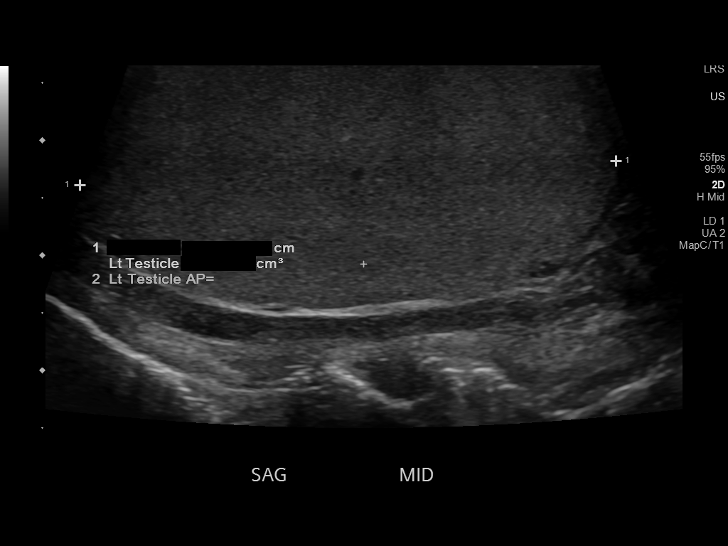
[im 31/38]
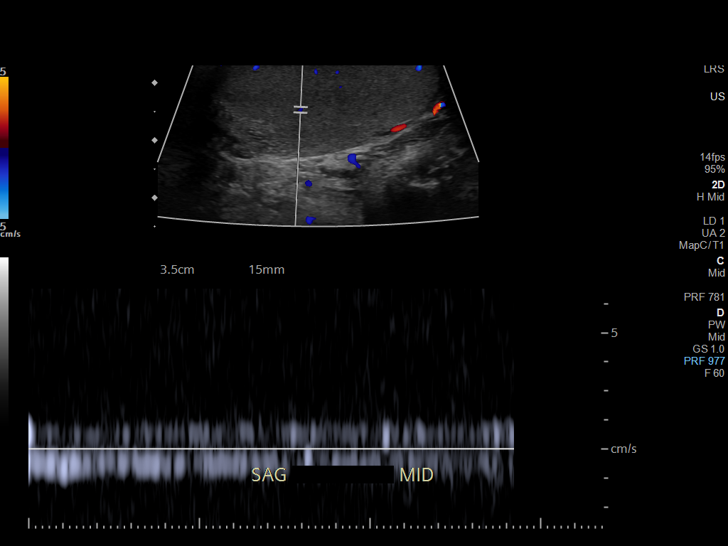
[im 34/38]
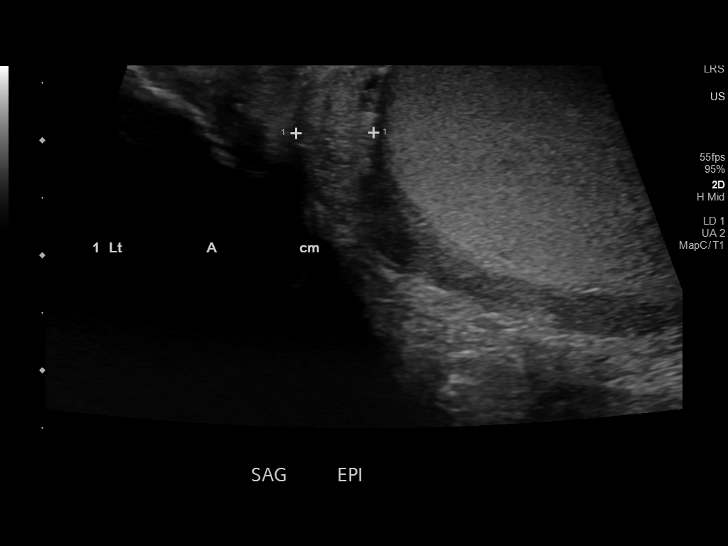
[im 38/38]
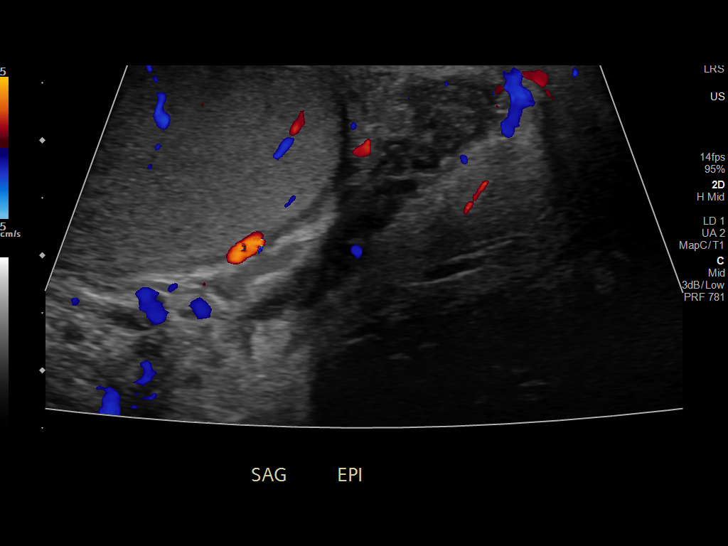

[14 of 25 positions shown; findings below may reference images not displayed]

FINDINGS: Right testicle

Measurements: 4.8 x 3.2 x 3.2 cm. Heterogenous echotexture but
without mass lesion. Slightly hyperemic compared to the left testis.

Left testicle

Measurements: 4.7 x 2.6 x 3 cm. No mass or microlithiasis
visualized.

Right epididymis: Enlarged heterogenous epididymis. Slightly
hyperemic

Left epididymis:  Normal in size and appearance.

Hydrocele:  Small complex right hydrocele.

Varicocele:  None visualized.

Pulsed Doppler interrogation of both testes demonstrates normal low
resistance arterial and venous waveforms bilaterally.
IMPRESSION: 1. Enlarged heterogeneous right epididymis with slight increased
vascularity suggesting epididymitis. The right testis is
heterogeneous in echotexture and also slightly hypervascular
suggesting orchitis.
2. Small complex right hydrocele

## 2020-02-16 IMAGING — DX PORTABLE CHEST - 1 VIEW
1 series · 1 of 1 positions shown · non-contrast
Comparison: None.

CLINICAL DATA: [id number] positive

EXAM:
PORTABLE CHEST 1 VIEW

[chest ap]
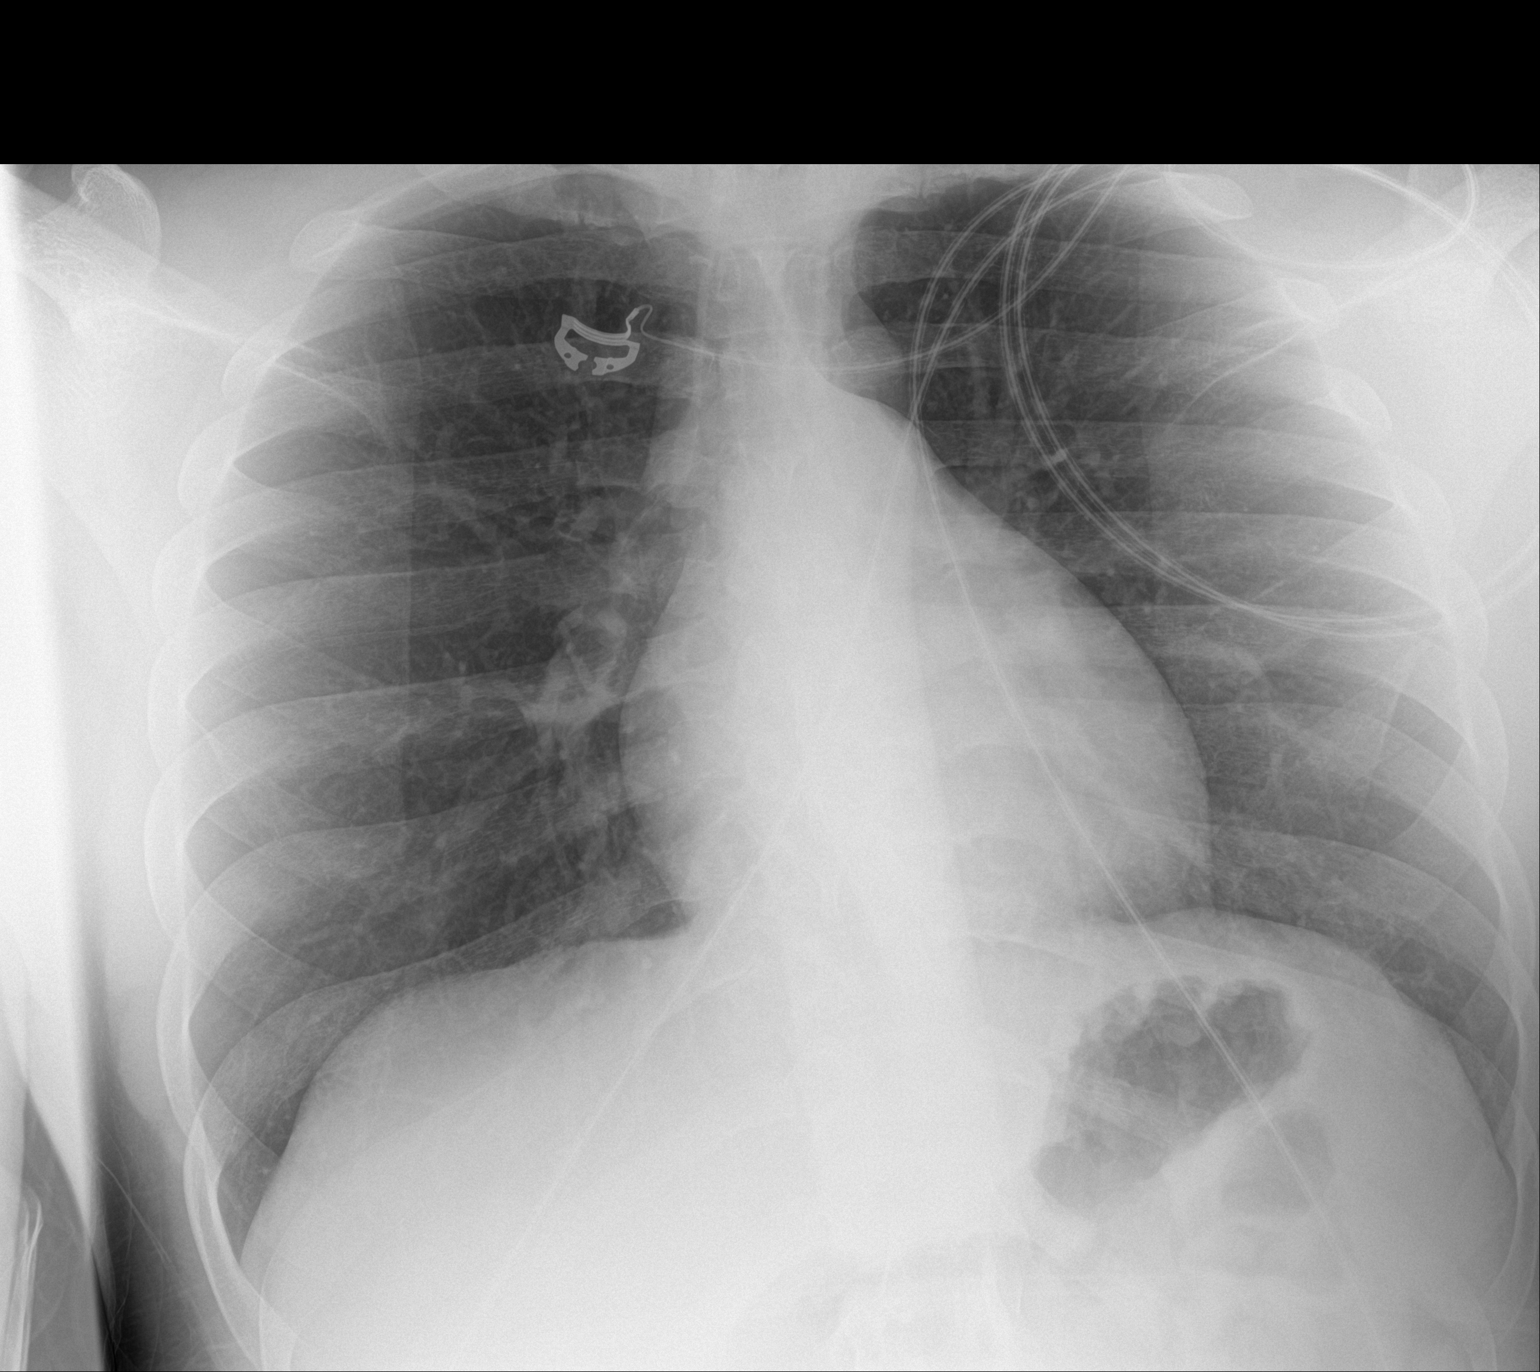

[1 of 1 positions shown; findings below may reference images not displayed]

FINDINGS: The heart size and mediastinal contours are within normal limits.
Both lungs are clear. The visualized skeletal structures are
unremarkable.
IMPRESSION: No active disease.

## 2020-08-08 ENCOUNTER — Other Ambulatory Visit: Payer: Self-pay | Admitting: Internal Medicine

## 2020-08-08 DIAGNOSIS — N50812 Left testicular pain: Secondary | ICD-10-CM

## 2020-08-08 DIAGNOSIS — N5089 Other specified disorders of the male genital organs: Secondary | ICD-10-CM

## 2020-08-15 ENCOUNTER — Other Ambulatory Visit: Payer: Medicaid Other

## 2020-08-21 ENCOUNTER — Ambulatory Visit
Admission: RE | Admit: 2020-08-21 | Discharge: 2020-08-21 | Disposition: A | Discharge status: Home / Self Care | Payer: Medicaid Other | Source: Ambulatory Visit | Attending: Internal Medicine | Admitting: Internal Medicine

## 2020-08-21 DIAGNOSIS — N50812 Left testicular pain: Secondary | ICD-10-CM

## 2020-08-21 DIAGNOSIS — N5089 Other specified disorders of the male genital organs: Secondary | ICD-10-CM

## 2021-03-10 ENCOUNTER — Other Ambulatory Visit: Payer: Self-pay

## 2021-03-10 ENCOUNTER — Ambulatory Visit: Payer: Medicaid Other | Attending: Orthopedic Surgery

## 2021-03-10 DIAGNOSIS — M6281 Muscle weakness (generalized): Secondary | ICD-10-CM | POA: Diagnosis present

## 2021-03-10 DIAGNOSIS — M25611 Stiffness of right shoulder, not elsewhere classified: Secondary | ICD-10-CM

## 2021-03-10 DIAGNOSIS — M25511 Pain in right shoulder: Secondary | ICD-10-CM

## 2021-03-10 DIAGNOSIS — R293 Abnormal posture: Secondary | ICD-10-CM | POA: Insufficient documentation

## 2021-03-10 NOTE — Patient Instructions (Signed)
Access Code: [email or web address] URL: [email or web address] Date: 03/10/2021 Prepared by: Claude Manges  Exercises Scapular Retraction with Resistance - 1 x daily - 7 x weekly - 3 sets - 10 reps Seated Scapular Protraction with Resistance - 1 x daily - 7 x weekly - 2 sets - 10 reps Seated Shoulder Shrugs - 1 x daily - 7 x weekly - 2 sets - 10 reps Seated Chest Stretch with Hands Behind Head - 1 x daily - 7 x weekly - 4 sets - 30 seconds hold Seated Upper Trapezius Stretch - 1 x daily - 7 x weekly - 4 sets - 30 sec hold

## 2021-03-10 NOTE — Therapy (Signed)
[facility] [place] [facility] [address] [place], [place], [address] Phone: [phone]   Fax:  [phone]  Physical Therapy Evaluation  Patient Details  Name: Donald Harvey MRN: [id number] Date of Birth: [birth year] Referring Provider (PT): Teryl Lucy, MD   Encounter Date: 03/10/2021   PT End of Session - 03/10/21 0846    Visit Number 1    Number of Visits 13    Date for PT Re-Evaluation 04/21/21    Authorization Type Healthy blue - no estim, vaso, ionto, BFB    PT Start Time 0814    PT Stop Time 0843    PT Time Calculation (min) 29 min    Activity Tolerance Patient tolerated treatment well    Behavior During Therapy [facility] for tasks assessed/performed           Past Medical History:  Diagnosis Date  . Broken ankle   . Childhood asthma   . Embolism - blood clot   . Spinal paraplegia [facility])     Past Surgical History:  Procedure Laterality Date  . NO PAST SURGERIES      There were no vitals filed for this visit.    Subjective Assessment - 03/10/21 0818    Subjective R shoulder pain with tenderness at biceps tendon. left hand dominant. Pain increases when reaching forward or reahcing backward. Feels like it gets stiff like he needs to pop it. History of dislocation in January when getting into car, slipping on ice when transferring into car.    Pertinent History PMH: paraplegia, Hx COVID in 2020. [facility] PT [facility] not meeting goals, limited by pain with left ankle/AFO. Neurogenic bladder.    Patient Stated Goals To get back to where it was, arm stronger and normal    Currently in Pain? Yes    Pain Score --   0/10 right now, otherwise usually around a 3/10   Pain Location Shoulder    Pain Orientation Right    Pain Descriptors / Indicators Tightness   constant stiffness   Pain Type Chronic pain    Pain Onset More than a month ago    Aggravating Factors  putting too much weight on it    Pain Relieving Factors rest, ice               [facility] PT Assessment - 03/10/21 8185      Assessment   Medical Diagnosis Rt Shoulder strain    Referring Provider (PT) Teryl Lucy, MD    Hand Dominance Left    Next MD Visit in about a month      Balance Screen   Has the patient fallen in the past 6 months Yes    Has the patient had a decrease in activity level because of a fear of falling?  No    Is the patient reluctant to leave their home because of a fear of falling?  No      Home Environment   Additional Comments apartment with mother. 2nd floor. walks up the stairs and carries WC like kttlebeell up the steps      Prior Function   Level of Independence Independent with basic ADLs    Vocation Unemployed    Leisure [facility] [employer] basketball - uses copperfit brace, pain with reaching forward.      Cognition   Overall Cognitive Status Within Functional Limits for tasks assessed      Observation/Other Assessments   Focus on Therapeutic Outcomes (FOTO)  to be  assessed      ROM / Strength   AROM / PROM / Strength AROM;Strength      AROM   Overall AROM Comments R shoulder - flexion full and WNL with heavy/tightness reported at end range top of shoulder.  - ABD full and WNL reports pain at 110 deg and at end range. -  IR to 50 deg, ER to 85 deg with incr pain.    FER to T4 on the right vs T7 on the left.      Strength   Overall Strength Comments overall 4+/5 with minimal incr in r shoulder pain with resisted motions.      Special Tests    Special Tests Biceps/Labral Tests    Other special tests negative empty can and full can.                      Objective measurements completed on examination: See above findings.               PT Education - 03/10/21 0845    Education Details Initial PT POC and HEP. Access Code: [email or web address]  URL: [email or web address]  Exercises  Scapular Retraction with Resistance (RED TB)- 1 x daily - 7 x weekly - 3 sets - 10 reps  Seated Scapular  Protraction with Resistance (RED TB)- 1 x daily - 7 x weekly - 2 sets - 10 reps  Seated Shoulder Shrugs - 1 x daily - 7 x weekly - 2 sets - 10 reps  Seated Chest Stretch with Hands Behind Head - 1 x daily - 7 x weekly - 4 sets - 30 seconds hold  Seated Upper Trapezius Stretch - 1 x daily - 7 x weekly - 4 sets - 30 sec hold     Person(s) Educated Patient    Methods Explanation;Demonstration;Handout    Comprehension Verbalized understanding;Returned demonstration            PT Short Term Goals - 03/10/21 0904      PT SHORT TERM GOAL #1   Title Independent with initial HEP    Time 2    Period Weeks    Status New    Target Date 03/24/21      PT SHORT TERM GOAL #2   Title Will assess balance, transfers, gait in order to better understand fall risk.    Time 4    Period Weeks    Status New    Target Date 04/07/21             PT Long Term Goals - 03/10/21 1130      PT LONG TERM GOAL #1   Title Pt will be independent with advanced HEP    Time 6    Period Weeks    Target Date 04/21/21      PT LONG TERM GOAL #2   Title Pt will demonstrate full and symmetrical shoulder ROM with </= 1/10 pain or discomfort in the shoulder    Time 6    Status New    Target Date 04/21/21      PT LONG TERM GOAL #3   Title Pt will report 75% reduction in right shoulder pain with functional reaching, ADLs, and recreational wellness activities such as WC basketball    Time 6    Period Weeks    Status New    Target Date 04/21/21      PT LONG TERM GOAL #4   Title Obtain shoulder FOTO and improve this  by at least 12 points to demo improved shoulder function    Time 6    Period Weeks    Status New    Target Date 04/21/21                  Plan - 03/10/21 0846    Clinical Impression Statement Pt is a 30 yo male who presents with R shoulder pain s/p reported R shoulder dislocation when getting into car in icy conditions this past january. PMH significant for paraplegia, uses lightweight WC  for mobility primarily and reports no incr pain with this, reports he does stand for transfers and for stairs but this was not assessed at this visit d/t time constraints. He currently presents with pain with endrange shoulder mobility on the right, slightly decreased shoulder ROM on the right, and abnormal posture with rounded shoulders and winging scapulae.  He will benefit from skilled PT to work on improving functional mobility and stability of the right shoulder, prevent further shoulder injury, and to further assess any balance/mobility deficits.    Stability/Clinical Decision Making Evolving/Moderate complexity    Clinical Decision Making Moderate    Rehab Potential Good    PT Frequency 2x / week    PT Duration 6 weeks   (4-6 weeks)   PT Treatment/Interventions Therapeutic activities;Therapeutic exercise;Neuromuscular re-education;Patient/family education;Energy conservation;Manual techniques    PT Next Visit Plan shoulder FOTO, Reassess HEP, Further assess safety with balance/transfers/gait as indicated. TE with scap stability and strengthening    PT Home Exercise Plan see pt edu    Consulted and Agree with Plan of Care Patient           Patient will benefit from skilled therapeutic intervention in order to improve the following deficits and impairments:  Difficulty walking,Pain,Decreased strength,Decreased mobility,Postural dysfunction  Visit Diagnosis: Muscle weakness (generalized) - Plan: PT plan of care cert/re-cert  Abnormal posture - Plan: PT plan of care cert/re-cert  Acute pain of right shoulder - Plan: PT plan of care cert/re-cert  Stiffness of right shoulder, not elsewhere classified - Plan: PT plan of care cert/re-cert     Problem List Patient Active Problem List   Diagnosis Date Noted  . 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 Food allergy 06/19/2012  . 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 Hypercoagulable state  (HCC) 09/22/2011  . Neurogenic bladder 09/22/2011  . Neurogenic bowel 09/22/2011    Anson Crofts, PT, DPT 03/10/2021, 11:37 AM  [facility] [place] [address] [place], [place], [address] Phone: [phone]   Fax:  [phone]  Name: GARV KUECHLE MRN: [id number] Date of Birth: 1991/05/12

## 2021-03-19 ENCOUNTER — Ambulatory Visit: Payer: Medicaid Other | Admitting: Rehabilitative and Restorative Service Providers"

## 2021-03-21 ENCOUNTER — Ambulatory Visit: Payer: Medicaid Other

## 2021-03-21 ENCOUNTER — Other Ambulatory Visit: Payer: Self-pay

## 2021-03-21 DIAGNOSIS — M25611 Stiffness of right shoulder, not elsewhere classified: Secondary | ICD-10-CM

## 2021-03-21 DIAGNOSIS — R293 Abnormal posture: Secondary | ICD-10-CM

## 2021-03-21 DIAGNOSIS — M25511 Pain in right shoulder: Secondary | ICD-10-CM

## 2021-03-21 DIAGNOSIS — M6281 Muscle weakness (generalized): Secondary | ICD-10-CM

## 2021-03-21 NOTE — Therapy (Signed)
[facility] [place] [facility] [address] [place], [place], [address] Phone: [phone]   Fax:  [phone]  Physical Therapy Treatment  Patient Details  Name: Donald Harvey MRN: [id number] Date of Birth: 20-Apr-1991 Referring Provider (PT): Teryl Lucy, MD   Encounter Date: 03/21/2021   PT End of Session - 03/21/21 0952    Visit Number 2    Number of Visits 13    Date for PT Re-Evaluation 04/21/21    Authorization Type Healthy blue - no estim, vaso, ionto, BFB    PT Start Time 0930    PT Stop Time 1010    PT Time Calculation (min) 40 min    Activity Tolerance Patient tolerated treatment well    Behavior During Therapy [facility] for tasks assessed/performed           Past Medical History:  Diagnosis Date  . Broken ankle   . Childhood asthma   . Embolism - blood clot   . Spinal paraplegia [facility])     Past Surgical History:  Procedure Laterality Date  . NO PAST SURGERIES      There were no vitals filed for this visit.   Subjective Assessment - 03/21/21 0932    Subjective Doing exercises a couple times per day. pain kicks in when lifting heavy but not at rest.    Pertinent History PMH: paraplegia, Hx COVID in 2020. Had PT [facility] not meeting goals, limited by pain with left ankle/AFO. Neurogenic bladder.    Patient Stated Goals To get back to where it was, arm stronger and normal    Currently in Pain? No/denies    Pain Score 0-No pain    Pain Onset More than a month ago               [facility] Adult PT Treatment/Exercise - 03/21/21 0001      Exercises   Exercises Shoulder;Neck      Neck Exercises: Machines for Strengthening   Power Tower modified lat pull while seated in WC 10 x 2 15#      Neck Exercises: Theraband   Scapula Retraction 15 reps;Green   2 sets   Shoulder External Rotation 20 reps;Green    Shoulder Internal Rotation 20 reps;Green    Horizontal ABduction 20 reps;Green      Neck Exercises: Seated    Shoulder ABduction Both;20 reps;Weights    Shoulder Abduction Weights (lbs) 2      Shoulder Exercises: Seated   Protraction Strengthening;Right   ball on wall 10 CW/CCW x 2 sets   Other Seated Exercises 2# wate bar Chest press to OHP 10 x 2. 2nd set with right scap assist with decreased top of shoulder pressure.      Shoulder Exercises: ROM/Strengthening   Rhythmic Stabilization, Seated 15 seconds x 3 with multidirectional perturbations    Other ROM/Strengthening Exercises shoulder shruggs 2# 2 x 15                    PT Short Term Goals - 03/10/21 0904      PT SHORT TERM GOAL #1   Title Independent with initial HEP    Time 2    Period Weeks    Status New    Target Date 03/24/21      PT SHORT TERM GOAL #2   Title Will assess balance, transfers, gait in order to better understand fall risk.    Time 4    Period Weeks  Status New    Target Date 04/07/21             PT Long Term Goals - 03/10/21 1130      PT LONG TERM GOAL #1   Title Pt will be independent with advanced HEP    Time 6    Period Weeks    Target Date 04/21/21      PT LONG TERM GOAL #2   Title Pt will demonstrate full and symmetrical shoulder ROM with </= 1/10 pain or discomfort in the shoulder    Time 6    Status New    Target Date 04/21/21      PT LONG TERM GOAL #3   Title Pt will report 75% reduction in right shoulder pain with functional reaching, ADLs, and recreational wellness activities such as WC basketball    Time 6    Period Weeks    Status New    Target Date 04/21/21      PT LONG TERM GOAL #4   Title Obtain shoulder FOTO and improve this by at least 12 points to demo improved shoulder function    Time 6    Period Weeks    Status New    Target Date 04/21/21                 Plan - 03/21/21 1011    Clinical Impression Statement Donald Harvey tolerated todays PT session well with progression of shoulder and scap stab exercises. Did get some mms fatigue with exercises  warranting rest breaks between exercises.  Reports some tightness at top/front of shoudler with arm elevation exercises  but this was relieved with manual scap assist for upward rotation. WIll benefit further from scpaular stabilization and strengthening.    Stability/Clinical Decision Making Evolving/Moderate complexity    Rehab Potential Good    PT Frequency 2x / week    PT Duration 6 weeks   (4-6 weeks)   PT Treatment/Interventions Therapeutic activities;Therapeutic exercise;Neuromuscular re-education;Patient/family education;Energy conservation;Manual techniques    PT Next Visit Plan Further assess safety with balance/transfers/gait as indicated. TE with scap stability and strengthening    PT Home Exercise Plan see pt edu    Consulted and Agree with Plan of Care Patient           Patient will benefit from skilled therapeutic intervention in order to improve the following deficits and impairments:  Difficulty walking,Pain,Decreased strength,Decreased mobility,Postural dysfunction  Visit Diagnosis: Muscle weakness (generalized)  Abnormal posture  Acute pain of right shoulder  Stiffness of right shoulder, not elsewhere classified     Problem List Patient Active Problem List   Diagnosis Date Noted  . 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 Food allergy 06/19/2012  . 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 Hypercoagulable state (HCC) 09/22/2011  . Neurogenic bladder 09/22/2011  . Neurogenic bowel 09/22/2011    Anson Crofts, PT, DPT 03/21/2021, 10:14 AM  [facility] [place] [facility] [address] [place], [place], [address] Phone: [phone]   Fax:  [phone]  Name: Donald Harvey MRN: [id number] Date of Birth: [birth year]

## 2021-03-24 ENCOUNTER — Other Ambulatory Visit: Payer: Self-pay

## 2021-03-24 ENCOUNTER — Ambulatory Visit: Payer: Medicaid Other

## 2021-03-24 DIAGNOSIS — M25511 Pain in right shoulder: Secondary | ICD-10-CM

## 2021-03-24 DIAGNOSIS — M6281 Muscle weakness (generalized): Secondary | ICD-10-CM | POA: Diagnosis not present

## 2021-03-24 DIAGNOSIS — R293 Abnormal posture: Secondary | ICD-10-CM

## 2021-03-24 DIAGNOSIS — M25611 Stiffness of right shoulder, not elsewhere classified: Secondary | ICD-10-CM

## 2021-03-24 NOTE — Therapy (Signed)
[facility] [place] [facility] [address] [place], [place], [address] Phone: [phone]   Fax:  [phone]  Physical Therapy Treatment  Patient Details  Name: Donald Harvey MRN: [id number] Date of Birth: 09-12-1991 Referring Provider (PT): Teryl Lucy, MD   Encounter Date: 03/24/2021   PT End of Session - 03/24/21 1319    Visit Number 2   #3 including initial eval.   Number of Visits 13    Date for PT Re-Evaluation 04/21/21    Authorization Type Healthy blue - no estim, vaso, ionto, BFB    PT Start Time 1315    PT Stop Time 1353    PT Time Calculation (min) 38 min    Activity Tolerance Patient tolerated treatment well    Behavior During Therapy W[facility] for tasks assessed/performed           Past Medical History:  Diagnosis Date  . Broken ankle   . Childhood asthma   . Embolism - blood clot   . Spinal paraplegia [facility])     Past Surgical History:  Procedure Laterality Date  . NO PAST SURGERIES      There were no vitals filed for this visit.   Subjective Assessment - 03/24/21 1317    Subjective A little better, just some soreness    Pertinent History PMH: paraplegia, Hx COVID in 2020. Had PT [facility] not meeting goals, limited by pain with left ankle/AFO. Neurogenic bladder.    Patient Stated Goals To get back to where it was, arm stronger and normal    Currently in Pain? No/denies    Pain Onset More than a month ago                             [facility] Adult PT Treatment/Exercise - 03/24/21 0001      Neck Exercises: Machines for Strengthening   Power Tower modified lat pull while seated in WC 15 x 2 25#.  shoulder ext, Row 10# BUE 10 x 2. pulley chest press 10# each UE 10 x 3      Neck Exercises: Theraband   Scapula Retraction 20 reps;Green   2 sets   Shoulder External Rotation Green   2x15   Shoulder Internal Rotation Green;15 reps   2 sets   Horizontal ABduction Green;15 reps   2 sets      Neck Exercises: Seated   Shoulder ABduction Both;Weights;15 reps   2 sets   Shoulder Abduction Weights (lbs) 2      Shoulder Exercises: Seated   Protraction Strengthening;Right   ball on wall 10 CW/CCW x 2 sets. pulley punch 10# x 10   Other Seated Exercises 2# wate bar Chest press to OHP 15 x 2. 2nd set with right scap assist with decreased top of shoulder pressure. Shoulder shrug 15x 2 with 5" holds and 5# DB    Other Seated Exercises seated push up/scap depression 10x2 with 5" holds.      Shoulder Exercises: ROM/Strengthening   Rhythmic Stabilization, Seated 15 seconds x 3 with multidirectional perturbations    Other ROM/Strengthening Exercises resisted scapular diagonals x 10 each way                    PT Short Term Goals - 03/10/21 0904      PT SHORT TERM GOAL #1   Title Independent with initial HEP    Time 2    Period  Weeks    Status New    Target Date 03/24/21      PT SHORT TERM GOAL #2   Title Will assess balance, transfers, gait in order to better understand fall risk.    Time 4    Period Weeks    Status New    Target Date 04/07/21             PT Long Term Goals - 03/10/21 1130      PT LONG TERM GOAL #1   Title Pt will be independent with advanced HEP    Time 6    Period Weeks    Target Date 04/21/21      PT LONG TERM GOAL #2   Title Pt will demonstrate full and symmetrical shoulder ROM with </= 1/10 pain or discomfort in the shoulder    Time 6    Status New    Target Date 04/21/21      PT LONG TERM GOAL #3   Title Pt will report 75% reduction in right shoulder pain with functional reaching, ADLs, and recreational wellness activities such as WC basketball    Time 6    Period Weeks    Status New    Target Date 04/21/21      PT LONG TERM GOAL #4   Title Obtain shoulder FOTO and improve this by at least 12 points to demo improved shoulder function    Time 6    Period Weeks    Status New    Target Date 04/21/21                  Plan - 03/24/21 1320    Clinical Impression Statement Continues to tolerate therex progressions nicely with addition repetitions and weights increased today. Decreased shoulder discomfort with arm elevation exercies without scap assist but does still get relief with manual PT assist for upward rotation. Trialed some scapular diagonals with/without resistance which were challenging today. He will benefit from further scapular strengthening and stabilization.    Stability/Clinical Decision Making Evolving/Moderate complexity    Rehab Potential Good    PT Frequency 2x / week    PT Duration 6 weeks   (4-6 weeks)   PT Treatment/Interventions Therapeutic activities;Therapeutic exercise;Neuromuscular re-education;Patient/family education;Energy conservation;Manual techniques    PT Next Visit Plan TE with scap stability and strengthening. Plan to reassess HEP, upgrade resistance as tolerated next visit.    PT Home Exercise Plan --    Consulted and Agree with Plan of Care Patient           Patient will benefit from skilled therapeutic intervention in order to improve the following deficits and impairments:  Difficulty walking,Pain,Decreased strength,Decreased mobility,Postural dysfunction  Visit Diagnosis: Muscle weakness (generalized)  Abnormal posture  Acute pain of right shoulder  Stiffness of right shoulder, not elsewhere classified     Problem List Patient Active Problem List   Diagnosis Date Noted  . 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 Food allergy 06/19/2012  . 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 Hypercoagulable state (HCC) 09/22/2011  . Neurogenic bladder 09/22/2011  . Neurogenic bowel 09/22/2011    Anson Crofts, PT, DPT 03/24/2021, 1:58 PM  [facility] [facility] [address] [place], [place], [address] Phone: [phone]   Fax:  [phone]  Name: Donald Harvey MRN: [id number] Date of Birth: 10-06-1991

## 2021-03-26 ENCOUNTER — Other Ambulatory Visit: Payer: Self-pay

## 2021-03-26 ENCOUNTER — Encounter: Payer: Self-pay | Admitting: Physical Therapy

## 2021-03-26 ENCOUNTER — Ambulatory Visit: Payer: Medicaid Other | Admitting: Physical Therapy

## 2021-03-26 DIAGNOSIS — M25511 Pain in right shoulder: Secondary | ICD-10-CM

## 2021-03-26 DIAGNOSIS — M6281 Muscle weakness (generalized): Secondary | ICD-10-CM

## 2021-03-26 DIAGNOSIS — M25611 Stiffness of right shoulder, not elsewhere classified: Secondary | ICD-10-CM

## 2021-03-26 DIAGNOSIS — R293 Abnormal posture: Secondary | ICD-10-CM

## 2021-03-26 NOTE — Therapy (Signed)
[facility] [place] [facility] [address] [place], [place], [address] Phone: [phone]   Fax:  [phone]  Physical Therapy Treatment  Patient Details  Name: Donald Harvey MRN: [id number] Date of Birth: 02-Jan-1991 Referring Provider (PT): Teryl Lucy, MD   Encounter Date: 03/26/2021   PT End of Session - 03/26/21 1553    Visit Number 3    Number of Visits 13    Date for PT Re-Evaluation 04/21/21    Authorization Type Healthy blue - no estim, vaso, ionto, BFB    PT Start Time 1515    PT Stop Time 1558    PT Time Calculation (min) 43 min    Activity Tolerance Patient tolerated treatment well    Behavior During Therapy [facility] for tasks assessed/performed           Past Medical History:  Diagnosis Date  . Broken ankle   . Childhood asthma   . Embolism - blood clot   . Spinal paraplegia [facility])     Past Surgical History:  Procedure Laterality Date  . NO PAST SURGERIES      There were no vitals filed for this visit.   Subjective Assessment - 03/26/21 1517    Subjective Doing good, no pain    Pertinent History PMH: paraplegia, Hx COVID in 2020. Had PT at [facility] 2017 not meeting goals, limited by pain with left ankle/AFO. Neurogenic bladder.    Currently in Pain? No/denies                             [facility] Adult PT Treatment/Exercise - 03/26/21 0001      Neck Exercises: Machines for Strengthening   UBE (Upper Arm Bike) L3.5 3x min each    Power Tower modified lat pull & seated wide grip rows while seated in WC 2x15 90#.      Neck Exercises: Theraband   Shoulder External Rotation Green;20 reps    Shoulder Internal Rotation Green;20 reps      Shoulder Exercises: Seated   Horizontal ABduction Strengthening;Theraband;20 reps    Theraband Level (Shoulder Horizontal ABduction) Level 3 (Green)    Flexion Weights;20 reps;Both;Strengthening   some pain with 4lb   Flexion Weight (lbs) 2    Abduction  Strengthening;Both;20 reps;Weights    ABduction Weight (lbs) 4    Other Seated Exercises 2lb ER abd to 90 deg 2x10; RUE Tricep Ext green 2x15    Other Seated Exercises OHP 7lb 2x10   2/10 R shoulder pain                   PT Short Term Goals - 03/26/21 1558      PT SHORT TERM GOAL #1   Title Independent with initial HEP    Status Achieved             PT Long Term Goals - 03/10/21 1130      PT LONG TERM GOAL #1   Title Pt will be independent with advanced HEP    Time 6    Period Weeks    Target Date 04/21/21      PT LONG TERM GOAL #2   Title Pt will demonstrate full and symmetrical shoulder ROM with </= 1/10 pain or discomfort in the shoulder    Time 6    Status New    Target Date 04/21/21      PT LONG TERM GOAL #3   Title  Pt will report 75% reduction in right shoulder pain with functional reaching, ADLs, and recreational wellness activities such as WC basketball    Time 6    Period Weeks    Status New    Target Date 04/21/21      PT LONG TERM GOAL #4   Title Obtain shoulder FOTO and improve this by at least 12 points to demo improved shoulder function    Time 6    Period Weeks    Status New    Target Date 04/21/21                 Plan - 03/26/21 1554    Clinical Impression Statement Pt did well with a progressed therapy session. Pt was independent transferring  from [facility] to UBE seat. He was able to complete all exercise interventions, Some pain reported with OHP and flexion. UE fatigue noted with external rotation while UE abducted to 90 degrees. Cues to squeeze scapulas together with wide grip rows.    Stability/Clinical Decision Making Evolving/Moderate complexity    Rehab Potential Good    PT Frequency 2x / week    PT Duration 6 weeks    PT Treatment/Interventions Therapeutic activities;Therapeutic exercise;Neuromuscular re-education;Patient/family education;Energy conservation;Manual techniques    PT Next Visit Plan TE with scap stability and  strengthening, upgrade resistance as tolerated next visit.           Patient will benefit from skilled therapeutic intervention in order to improve the following deficits and impairments:  Difficulty walking,Pain,Decreased strength,Decreased mobility,Postural dysfunction  Visit Diagnosis: Muscle weakness (generalized)  Acute pain of right shoulder  Abnormal posture  Stiffness of right shoulder, not elsewhere classified     Problem List Patient Active Problem List   Diagnosis Date Noted  . 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 Food allergy 06/19/2012  . 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 Hypercoagulable state (HCC) 09/22/2011  . Neurogenic bladder 09/22/2011  . Neurogenic bowel 09/22/2011    Grayce Sessions 03/26/2021, 3:59 PM  [facility] [place] [address] [place], [place], [address] Phone: [phone]   Fax:  [phone]  Name: Donald Harvey MRN: [id number] Date of Birth: 1991/10/25

## 2021-04-01 ENCOUNTER — Ambulatory Visit: Payer: Medicaid Other | Admitting: Physical Therapy

## 2021-04-01 ENCOUNTER — Other Ambulatory Visit: Payer: Self-pay

## 2021-04-01 ENCOUNTER — Encounter: Payer: Self-pay | Admitting: Physical Therapy

## 2021-04-01 DIAGNOSIS — M25611 Stiffness of right shoulder, not elsewhere classified: Secondary | ICD-10-CM

## 2021-04-01 DIAGNOSIS — R293 Abnormal posture: Secondary | ICD-10-CM

## 2021-04-01 DIAGNOSIS — M6281 Muscle weakness (generalized): Secondary | ICD-10-CM

## 2021-04-01 DIAGNOSIS — M25511 Pain in right shoulder: Secondary | ICD-10-CM

## 2021-04-01 NOTE — Therapy (Signed)
[facility] [place] [address] [place], [place], [address] Phone: [phone]   Fax:  [phone]  Physical Therapy Treatment  Patient Details  Name: Donald Harvey MRN: [id number] Date of Birth: [birth year] Referring Provider (PT): Teryl Lucy, MD   Encounter Date: 04/01/2021   PT End of Session - 04/01/21 1551    Visit Number 4    Date for PT Re-Evaluation 04/21/21    Authorization Type Healthy blue - no estim, vaso, ionto, BFB    PT Start Time 1515    PT Stop Time 1555    PT Time Calculation (min) 40 min    Activity Tolerance Patient tolerated treatment well    Behavior During Therapy [facility] for tasks assessed/performed           Past Medical History:  Diagnosis Date  . Broken ankle   . Childhood asthma   . Embolism - blood clot   . Spinal paraplegia [facility])     Past Surgical History:  Procedure Laterality Date  . NO PAST SURGERIES      There were no vitals filed for this visit.   Subjective Assessment - 04/01/21 1519    Subjective Got back late last night, a little tired    Currently in Pain? No/denies                             [facility] Adult PT Treatment/Exercise - 04/01/21 0001      Neck Exercises: Machines for Strengthening   UBE (Upper Arm Bike) L3 3x min each      Neck Exercises: Theraband   Shoulder External Rotation Green;15 reps   x2   Shoulder Internal Rotation Green;15 reps   x2   Horizontal ABduction Green;15 reps   x2     Neck Exercises: Seated   Other Seated Exercise Rows 25lb 2x15    Other Seated Exercise LAt pull downs in pulley 25lb 2x15 each      Shoulder Exercises: Seated   Flexion Strengthening;Both;20 reps;Weights    Flexion Weight (lbs) 4    Abduction Strengthening;Both;20 reps;Weights    ABduction Weight (lbs) 4    Other Seated Exercises 2lb ER abd to 90 deg 2x10;    Other Seated Exercises ALT OHP 7lb 2x10                    PT Short Term Goals -  03/26/21 1558      PT SHORT TERM GOAL #1   Title Independent with initial HEP    Status Achieved             PT Long Term Goals - 03/10/21 1130      PT LONG TERM GOAL #1   Title Pt will be independent with advanced HEP    Time 6    Period Weeks    Target Date 04/21/21      PT LONG TERM GOAL #2   Title Pt will demonstrate full and symmetrical shoulder ROM with </= 1/10 pain or discomfort in the shoulder    Time 6    Status New    Target Date 04/21/21      PT LONG TERM GOAL #3   Title Pt will report 75% reduction in right shoulder pain with functional reaching, ADLs, and recreational wellness activities such as WC basketball    Time 6    Period Weeks    Status New  Target Date 04/21/21      PT LONG TERM GOAL #4   Title Obtain shoulder FOTO and improve this by at least 12 points to demo improved shoulder function    Time 6    Period Weeks    Status New    Target Date 04/21/21                 Plan - 04/01/21 1552    Clinical Impression Statement Despite reporting shoulder fatigue pt did really well completing all of today's interventions. Again pt was I transferring to UBE seat. No reports of pain today with alternating over head pressed. increase resistance tolerated with active shoulder flexion. UE had their own pulley for modified lat pull downs. Some hypermobility noted with ER while UE abducted to 90    Stability/Clinical Decision Making Evolving/Moderate complexity    Rehab Potential Good    PT Frequency 2x / week    PT Duration 6 weeks    PT Treatment/Interventions Therapeutic activities;Therapeutic exercise;Neuromuscular re-education;Patient/family education;Energy conservation;Manual techniques    PT Next Visit Plan TE with scap stability and strengthening, upgrade resistance as tolerated next visit.           Patient will benefit from skilled therapeutic intervention in order to improve the following deficits and impairments:  Difficulty  walking,Pain,Decreased strength,Decreased mobility,Postural dysfunction  Visit Diagnosis: Abnormal posture  Stiffness of right shoulder, not elsewhere classified  Acute pain of right shoulder  Muscle weakness (generalized)     Problem List Patient Active Problem List   Diagnosis Date Noted  . 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 Food allergy 06/19/2012  . 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 Hypercoagulable state (HCC) 09/22/2011  . Neurogenic bladder 09/22/2011  . Neurogenic bowel 09/22/2011    Grayce Sessions, PTA 04/01/2021, 3:55 PM  [facility] [place] [facility] [address] [place], [place], [address] Phone: [phone]   Fax:  [phone]  Name: Donald Harvey MRN: [id number] Date of Birth: 19-Oct-1991

## 2021-04-03 ENCOUNTER — Ambulatory Visit: Payer: Medicaid Other | Attending: Orthopedic Surgery

## 2021-04-03 ENCOUNTER — Other Ambulatory Visit: Payer: Self-pay

## 2021-04-03 DIAGNOSIS — R293 Abnormal posture: Secondary | ICD-10-CM | POA: Diagnosis present

## 2021-04-03 DIAGNOSIS — M25611 Stiffness of right shoulder, not elsewhere classified: Secondary | ICD-10-CM | POA: Diagnosis present

## 2021-04-03 DIAGNOSIS — M25511 Pain in right shoulder: Secondary | ICD-10-CM | POA: Diagnosis present

## 2021-04-03 DIAGNOSIS — M6281 Muscle weakness (generalized): Secondary | ICD-10-CM | POA: Insufficient documentation

## 2021-04-03 NOTE — Therapy (Signed)
[facility] [place] [facility] [address] [place], [place], [address] Phone: [phone]   Fax:  [phone]  Physical Therapy Treatment  Patient Details  Name: Donald Harvey MRN: [id number] Date of Birth: 1991-04-10 Referring Provider (PT): Teryl Lucy, MD   Encounter Date: 04/03/2021   PT End of Session - 04/03/21 1407    Visit Number 5    Date for PT Re-Evaluation 04/21/21    Authorization Type Healthy blue - no estim, vaso, ionto, BFB    PT Start Time 1400    PT Stop Time 1445    PT Time Calculation (min) 45 min    Activity Tolerance Patient tolerated treatment well    Behavior During Therapy [facility] for tasks assessed/performed           Past Medical History:  Diagnosis Date  . Broken ankle   . Childhood asthma   . Embolism - blood clot   . Spinal paraplegia [facility])     Past Surgical History:  Procedure Laterality Date  . NO PAST SURGERIES      There were no vitals filed for this visit.   Subjective Assessment - 04/03/21 1405    Subjective Doing okay, some soreness by the shoulder blade but okay    Patient Stated Goals To get back to where it was, arm stronger and normal    Currently in Pain? No/denies               [facility] Adult PT Treatment/Exercise - 04/03/21 0001      Neck Exercises: Machines for Strengthening   UBE (Upper Arm Bike) L3 3x min each      Neck Exercises: Theraband   Shoulder External Rotation Green;15 reps   x2   Shoulder Internal Rotation Green;15 reps   x2   Horizontal ABduction Green;15 reps   x2   Other Theraband Exercises green TB Ws x 15      Neck Exercises: Seated   Other Seated Exercise Rows 10lb each  2x15    Other Seated Exercise Lat pull downs in pulley 25lb 2x15 each      Shoulder Exercises: Supine   Protraction Strengthening;Right;15 reps   2 sets   Protraction Weight (lbs) 4#      Shoulder Exercises: Seated   Flexion Strengthening;Both;Weights;15 reps   2 sets   Flexion  Weight (lbs) 4    Abduction Strengthening;Both;20 reps;Weights    ABduction Weight (lbs) --   trialed 3 reps with 4# wt ith some popping sensaitonn, rest of repetitions BW only without discomfort   Other Seated Exercises 5# abd 90 + ER with control x 3. High row to 90 deg x 10. ER 5# x 10   some shoulder discomfort reported   Other Seated Exercises OHP yellow med ball 2x10      Shoulder Exercises: ROM/Strengthening   Rhythmic Stabilization, Supine ABCs with protraction RUE x 1 rep A-Z RUE. RUE rhythmic stab at 90 deg flexion and with dynamic flex/ext x 5 reps                    PT Short Term Goals - 03/26/21 1558      PT SHORT TERM GOAL #1   Title Independent with initial HEP    Status Achieved             PT Long Term Goals - 03/10/21 1130      PT LONG TERM GOAL #1   Title Pt will be  independent with advanced HEP    Time 6    Period Weeks    Target Date 04/21/21      PT LONG TERM GOAL #2   Title Pt will demonstrate full and symmetrical shoulder ROM with </= 1/10 pain or discomfort in the shoulder    Time 6    Status New    Target Date 04/21/21      PT LONG TERM GOAL #3   Title Pt will report 75% reduction in right shoulder pain with functional reaching, ADLs, and recreational wellness activities such as WC basketball    Time 6    Period Weeks    Status New    Target Date 04/21/21      PT LONG TERM GOAL #4   Title Obtain shoulder FOTO and improve this by at least 12 points to demo improved shoulder function    Time 6    Period Weeks    Status New    Target Date 04/21/21                 Plan - 04/03/21 1431    Clinical Impression Statement Aside from soreness by shoulder blade pt tolerated all exercises well today. No reports of pain today with over head presses. C/o some shoulder pain with abduction with weight but this diminished when performed BW only.  Fatigue with band exercises toward end of session requiring short rests intemrittently in  each set. Added some supine scap stabilization exercises which were completed without pain, some fatigue/shaking noted toward the end of these. Continues to demo alot of scpaular winging and will benefit from continued focus on scap stabilization.    PT Next Visit Plan TE with scap stability and strengthening, upgrade resistance as tolerated next visit. possibly trial some prone strength/stabilization exercises (IYT, planks?) - discuss next visit    PT Home Exercise Plan Upgraded TB to green to use with home exercises.           Patient will benefit from skilled therapeutic intervention in order to improve the following deficits and impairments:     Visit Diagnosis: Abnormal posture  Stiffness of right shoulder, not elsewhere classified  Acute pain of right shoulder  Muscle weakness (generalized)     Problem List Patient Active Problem List   Diagnosis Date Noted  . 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 Food allergy 06/19/2012  . 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 Hypercoagulable state (HCC) 09/22/2011  . Neurogenic bladder 09/22/2011  . Neurogenic bowel 09/22/2011    Anson Crofts, PT, DPT 04/03/2021, 2:51 PM  [facility] [place] [facility] [address] [place], [place], [address] Phone: [phone]   Fax:  [phone]  Name: Donald Harvey MRN: [id number] Date of Birth: [birth year]

## 2021-04-07 ENCOUNTER — Ambulatory Visit: Payer: Medicaid Other

## 2021-04-07 ENCOUNTER — Other Ambulatory Visit: Payer: Self-pay

## 2021-04-07 DIAGNOSIS — R293 Abnormal posture: Secondary | ICD-10-CM | POA: Diagnosis not present

## 2021-04-07 DIAGNOSIS — M6281 Muscle weakness (generalized): Secondary | ICD-10-CM

## 2021-04-07 DIAGNOSIS — M25511 Pain in right shoulder: Secondary | ICD-10-CM

## 2021-04-07 DIAGNOSIS — M25611 Stiffness of right shoulder, not elsewhere classified: Secondary | ICD-10-CM

## 2021-04-07 NOTE — Therapy (Signed)
[facility] [place] [address] [place], [place], [address] Phone: [phone]   Fax:  [phone]  Physical Therapy Treatment  Patient Details  Name: Donald Harvey MRN: [id number] Date of Birth: 1991/02/23 Referring Provider (PT): Teryl Lucy, MD   Encounter Date: 04/07/2021   PT End of Session - 04/07/21 1303    Visit Number 6    Date for PT Re-Evaluation 04/21/21    Authorization Type Healthy blue - no estim, vaso, ionto, BFB    PT Start Time 1300    PT Stop Time 1345    PT Time Calculation (min) 45 min    Activity Tolerance Patient tolerated treatment well    Behavior During Therapy [facility] for tasks assessed/performed           Past Medical History:  Diagnosis Date  . Broken ankle   . Childhood asthma   . Embolism - blood clot   . Spinal paraplegia [facility])     Past Surgical History:  Procedure Laterality Date  . NO PAST SURGERIES      There were no vitals filed for this visit.   Subjective Assessment - 04/07/21 1302    Subjective Nothing different. Has an appointment schedueld with MD on wednesday but is going to have to rescheudle.    Patient Stated Goals To get back to where it was, arm stronger and normal    Currently in Pain? No/denies                   [facility] Adult PT Treatment/Exercise - 04/07/21 0001      Neck Exercises: Machines for Strengthening   UBE (Upper Arm Bike) L3 3x min each      Neck Exercises: Theraband   Shoulder External Rotation Blue;10 reps   2 sets   Horizontal ABduction Green;15 reps   x2   Other Theraband Exercises green TB Ws 2 x 15    Other Theraband Exercises yellow TB R Single arm high row to ER at 90 10 x 2.      Neck Exercises: Seated   Other Seated Exercise Rows 15# 2x10, pulley lat pull down 35# 2x 15      Neck Exercises: Prone   Plank on forearms: 5" x 10 with tactile cues for protraction. QP single armlifts x 10 B . Full planks 20" x 3 with tactile cues. Forearm  plank saws 5 x 2      Shoulder Exercises: Seated   Flexion Strengthening;Both;Weights;15 reps   2 sets   Flexion Weight (lbs) 4    Abduction Strengthening;Both;20 reps;Weights    Other Seated Exercises OHP blue med ball 2 x 15                    PT Short Term Goals - 03/26/21 1558      PT SHORT TERM GOAL #1   Title Independent with initial HEP    Status Achieved             PT Long Term Goals - 03/10/21 1130      PT LONG TERM GOAL #1   Title Pt will be independent with advanced HEP    Time 6    Period Weeks    Target Date 04/21/21      PT LONG TERM GOAL #2   Title Pt will demonstrate full and symmetrical shoulder ROM with </= 1/10 pain or discomfort in the shoulder    Time 6  Status New    Target Date 04/21/21      PT LONG TERM GOAL #3   Title Pt will report 75% reduction in right shoulder pain with functional reaching, ADLs, and recreational wellness activities such as WC basketball    Time 6    Period Weeks    Status New    Target Date 04/21/21      PT LONG TERM GOAL #4   Title Obtain shoulder FOTO and improve this by at least 12 points to demo improved shoulder function    Time 6    Period Weeks    Status New    Target Date 04/21/21                 Plan - 04/07/21 1332    Clinical Impression Statement Incorporated some prone/QP exercises including static and dynamic planks today to work on closed chain scap stab, observable shaking with mms fatigue although pt denies feeling fatigued with planks but much more difficulty reported with plank saws. Plan to continue to challenge scap stability as tolerated.    PT Next Visit Plan TE with scap stability and strengthening, upgrade resistance as tolerated next visit. possibly trial some prone strength/stabilization exercises (IYT, planks)    Consulted and Agree with Plan of Care Patient           Patient will benefit from skilled therapeutic intervention in order to improve the following  deficits and impairments:     Visit Diagnosis: Abnormal posture  Stiffness of right shoulder, not elsewhere classified  Acute pain of right shoulder  Muscle weakness (generalized)     Problem List Patient Active Problem List   Diagnosis Date Noted  . 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 Food allergy 06/19/2012  . 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 Hypercoagulable state (HCC) 09/22/2011  . Neurogenic bladder 09/22/2011  . Neurogenic bowel 09/22/2011    Anson Crofts, PT, DPT 04/07/2021, 1:47 PM  [facility] [place] [facility] [address] [place], [place], [address] Phone: [phone]   Fax:  [phone]  Name: Donald Harvey MRN: [id number] Date of Birth: 1991-09-18

## 2021-04-09 ENCOUNTER — Ambulatory Visit: Payer: Medicaid Other

## 2021-04-09 ENCOUNTER — Other Ambulatory Visit: Payer: Self-pay

## 2021-04-09 DIAGNOSIS — M25511 Pain in right shoulder: Secondary | ICD-10-CM

## 2021-04-09 DIAGNOSIS — R293 Abnormal posture: Secondary | ICD-10-CM

## 2021-04-09 DIAGNOSIS — M6281 Muscle weakness (generalized): Secondary | ICD-10-CM

## 2021-04-09 DIAGNOSIS — M25611 Stiffness of right shoulder, not elsewhere classified: Secondary | ICD-10-CM

## 2021-04-09 NOTE — Therapy (Signed)
[facility] [address] [place], [place], [address] Phone: [phone]   Fax:  [phone]  Physical Therapy Treatment  Patient Details  Name: Donald Harvey MRN: [id number] Date of Birth: Jan 10, 1991 Referring Provider (PT): Teryl Lucy, MD   Encounter Date: 04/09/2021   PT End of Session - 04/09/21 1328    Visit Number 7    Date for PT Re-Evaluation 04/21/21    Authorization Type Healthy blue - no estim, vaso, ionto, BFB    PT Start Time 1300    PT Stop Time 1340    PT Time Calculation (min) 40 min    Activity Tolerance Patient tolerated treatment well    Behavior During Therapy [facility] for tasks assessed/performed           Past Medical History:  Diagnosis Date  . Broken ankle   . Childhood asthma   . Embolism - blood clot   . Spinal paraplegia [facility])     Past Surgical History:  Procedure Laterality Date  . NO PAST SURGERIES      There were no vitals filed for this visit.   Subjective Assessment - 04/09/21 1328    Subjective No new changes    Patient Stated Goals To get back to where it was, arm stronger and normal    Currently in Pain? No/denies                             [facility] Adult PT Treatment/Exercise - 04/09/21 0001      Neck Exercises: Machines for Strengthening   UBE (Upper Arm Bike) L 3.5 x 3 min each      Neck Exercises: Theraband   Shoulder External Rotation Blue;10 reps   2 sets   Shoulder Internal Rotation Green;15 reps   2 sets   Horizontal ABduction Green;15 reps   x2   Other Theraband Exercises green TB Ws 2 x 15      Neck Exercises: Seated   Shoulder Flexion Right;15 reps   5#   Shoulder ABduction 15 reps;Right   5#   Other Seated Exercise Rows 20# 2x15, pulley lat pull down 45# 2x 15      Neck Exercises: Prone   Plank on forearms: 10" x 10 with tactile cues for protraction.  Forearm plank saws 5 x 4    Other Prone Exercise I Y & T x10 BW each,  2 x 10 with 3#  DBs      Shoulder Exercises: Seated   Other Seated Exercises OHP wate bar red + 2# and 2.5# uneven  2 x 15      Shoulder Exercises: ROM/Strengthening   Rhythmic Stabilization, Seated seated manually resisted scap retraction concentric vs eccentric 5 x 2                    PT Short Term Goals - 03/26/21 1558      PT SHORT TERM GOAL #1   Title Independent with initial HEP    Status Achieved             PT Long Term Goals - 03/10/21 1130      PT LONG TERM GOAL #1   Title Pt will be independent with advanced HEP    Time 6    Period Weeks    Target Date 04/21/21      PT LONG TERM GOAL #2   Title Pt will demonstrate  full and symmetrical shoulder ROM with </= 1/10 pain or discomfort in the shoulder    Time 6    Status New    Target Date 04/21/21      PT LONG TERM GOAL #3   Title Pt will report 75% reduction in right shoulder pain with functional reaching, ADLs, and recreational wellness activities such as WC basketball    Time 6    Period Weeks    Status New    Target Date 04/21/21      PT LONG TERM GOAL #4   Title Obtain shoulder FOTO and improve this by at least 12 points to demo improved shoulder function    Time 6    Period Weeks    Status New    Target Date 04/21/21                 Plan - 04/09/21 1329    Clinical Impression Statement Progressed prone and static/dynamic plank exercises today with some fatigue noted but overall tolerated well without exacerbation of pain. Equal motion noted with IYT exercises L vs R but did need cues for pacing, some scap winging noted. Continue to progress scap stabilization and strength as tolerated    PT Next Visit Plan TE with scap stability and strengthening, upgrade resistance as tolerated next visit.    Consulted and Agree with Plan of Care Patient           Patient will benefit from skilled therapeutic intervention in order to improve the following deficits and impairments:     Visit  Diagnosis: Abnormal posture  Stiffness of right shoulder, not elsewhere classified  Acute pain of right shoulder  Muscle weakness (generalized)     Problem List Patient Active Problem List   Diagnosis Date Noted  . 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 Food allergy 06/19/2012  . 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 Hypercoagulable state (HCC) 09/22/2011  . Neurogenic bladder 09/22/2011  . Neurogenic bowel 09/22/2011    Anson Crofts, PT, DPT 04/09/2021, 1:44 PM  [facility] [address] [place], [place], [address] Phone: [phone]   Fax:  [phone]  Name: Donald Harvey MRN: [id number] Date of Birth: [birth year]

## 2021-04-14 ENCOUNTER — Other Ambulatory Visit: Payer: Self-pay

## 2021-04-14 ENCOUNTER — Ambulatory Visit: Payer: Medicaid Other

## 2021-04-14 DIAGNOSIS — M25511 Pain in right shoulder: Secondary | ICD-10-CM

## 2021-04-14 DIAGNOSIS — M6281 Muscle weakness (generalized): Secondary | ICD-10-CM

## 2021-04-14 DIAGNOSIS — M25611 Stiffness of right shoulder, not elsewhere classified: Secondary | ICD-10-CM

## 2021-04-14 DIAGNOSIS — R293 Abnormal posture: Secondary | ICD-10-CM | POA: Diagnosis not present

## 2021-04-14 NOTE — Therapy (Signed)
[facility] [address] [place], [place], [address] Phone: [phone]   Fax:  [phone]  Physical Therapy Treatment  Patient Details  Name: Donald Harvey MRN: [id number] Date of Birth: 1991-04-28 Referring Provider (PT): Teryl Lucy, MD   Encounter Date: 04/14/2021   PT End of Session - 04/14/21 1349     Visit Number 8    Date for PT Re-Evaluation 04/21/21    Authorization Type Healthy blue - no estim, vaso, ionto, BFB    PT Start Time 1345    PT Stop Time 1430    PT Time Calculation (min) 45 min    Activity Tolerance Patient tolerated treatment well    Behavior During Therapy WFL for tasks assessed/performed             Past Medical History:  Diagnosis Date   Broken ankle    Childhood asthma    Embolism - blood clot    Spinal paraplegia [facility])     Past Surgical History:  Procedure Laterality Date   NO PAST SURGERIES      There were no vitals filed for this visit.   Subjective Assessment - 04/14/21 1348     Subjective "Feels like its getting a little better"    Patient Stated Goals To get back to where it was, arm stronger and normal    Currently in Pain? No/denies                               [facility] Adult PT Treatment/Exercise - 04/14/21 0001       Neck Exercises: Machines for Strengthening   UBE (Upper Arm Bike) L 3.5 x 3 min each      Neck Exercises: Theraband   Shoulder External Rotation Blue;15 reps   2 sets   Shoulder Internal Rotation 15 reps;Blue   2 sets   Horizontal ABduction 15 reps;Blue   x2   Other Theraband Exercises blue TB Ws 2 x 15      Neck Exercises: Seated   Other Seated Exercise Rows 20# 2x15, pulley lat pull down 45# 2x 15      Neck Exercises: Prone   Plank on forearms: 10" x 5 with tactile cues for protraction.  Forearm plank saws 5 x 5. hands on BOSU lateral WS while maintainin scpa stab  x 10 B.  BOSU plank 10" x 5 with perturbations.     Other Prone Exercise I Y & T  2 x 15 with 3# DBs                      PT Short Term Goals - 03/26/21 1558       PT SHORT TERM GOAL #1   Title Independent with initial HEP    Status Achieved               PT Long Term Goals - 03/10/21 1130       PT LONG TERM GOAL #1   Title Pt will be independent with advanced HEP    Time 6    Period Weeks    Target Date 04/21/21      PT LONG TERM GOAL #2   Title Pt will demonstrate full and symmetrical shoulder ROM with </= 1/10 pain or discomfort in the shoulder    Time 6    Status New    Target Date 04/21/21  PT LONG TERM GOAL #3   Title Pt will report 75% reduction in right shoulder pain with functional reaching, ADLs, and recreational wellness activities such as WC basketball    Time 6    Period Weeks    Status New    Target Date 04/21/21      PT LONG TERM GOAL #4   Title Obtain shoulder FOTO and improve this by at least 12 points to demo improved shoulder function    Time 6    Period Weeks    Status New    Target Date 04/21/21                   Plan - 04/14/21 1349     Clinical Impression Statement Progressed reistance band level and Progressed prone and static/dynamic plank exercises today with less fatigue noted but overall tolerated well without exacerbation of pain.  Continue to progress scap stabilization and strength as tolerated. Blue band provided for HEP    PT Next Visit Plan TE with scap stability and strengthening, upgrade resistance as tolerated next visit.    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  04/14/2021, 2:29 PM  [facility] [address] [place], [place], [address] Phone: [phone]   Fax:  [phone]  Name: Donald Harvey MRN: [id number] Date of Birth: 04-29-1991

## 2021-04-16 ENCOUNTER — Other Ambulatory Visit: Payer: Self-pay

## 2021-04-16 ENCOUNTER — Ambulatory Visit: Payer: Medicaid Other

## 2021-04-16 DIAGNOSIS — R293 Abnormal posture: Secondary | ICD-10-CM

## 2021-04-16 DIAGNOSIS — M25511 Pain in right shoulder: Secondary | ICD-10-CM

## 2021-04-16 DIAGNOSIS — M25611 Stiffness of right shoulder, not elsewhere classified: Secondary | ICD-10-CM

## 2021-04-16 DIAGNOSIS — M6281 Muscle weakness (generalized): Secondary | ICD-10-CM

## 2021-04-16 NOTE — Therapy (Signed)
[facility] [address] [place], [place], [address] Phone: [phone]   Fax:  [phone]  Physical Therapy Treatment  Patient Details  Name: Donald Harvey MRN: [id number] Date of Birth: [birth year] Referring Provider (PT): Teryl Lucy, MD   Encounter Date: 04/16/2021   PT End of Session - 04/16/21 1211     Visit Number 9    Date for PT Re-Evaluation 04/21/21    Authorization Type Healthy blue - no estim, vaso, ionto, BFB    PT Start Time 1206    PT Stop Time 1250    PT Time Calculation (min) 44 min    Activity Tolerance Patient tolerated treatment well    Behavior During Therapy WFL for tasks assessed/performed             Past Medical History:  Diagnosis Date   Broken ankle    Childhood asthma    Embolism - blood clot    Spinal paraplegia [facility])     Past Surgical History:  Procedure Laterality Date   NO PAST SURGERIES      There were no vitals filed for this visit.   Subjective Assessment - 04/16/21 1210     Subjective "sore today"    Patient Stated Goals To get back to where it was, arm stronger and normal    Currently in Pain? No/denies                               [facility] Adult PT Treatment/Exercise - 04/16/21 0001       Neck Exercises: Machines for Strengthening   UBE (Upper Arm Bike) L4 x 3 min each      Neck Exercises: Theraband   Shoulder External Rotation Blue;15 reps   2 sets   Horizontal ABduction 15 reps;Blue   x2   Other Theraband Exercises green band D2 flexion and extension 2x10 RUE      Neck Exercises: Seated   Other Seated Exercise Rows 20# 2x15, pulley lat pull down 45# 2x 15    Other Seated Exercise chest press pulleys 15# B 10 x 2      Neck Exercises: Prone   Plank Forearm plank saws 10 x 3. hands on BOSU lateral WS while maintainin scpa stab 2 x 10 B.    Other Prone Exercise I Y & T  2 x 15 with 4# DBs      Shoulder Exercises: Seated   Other  Seated Exercises OHP blue med ball x15. Overhead circles functional ER/ABD 7# DB x10 CW/CCW      Shoulder Exercises: Stretch   Other Shoulder Stretches seated pec stretch hands behind head x 20 seconds                    PT Education - 04/16/21 1312     Education Details Alot of education provided regarding pec stretching  to further prevent forward shoulder and anterior shoulder stress with functional mobility    Person(s) Educated Patient    Methods Demonstration    Comprehension Returned demonstration;Verbalized understanding              PT Short Term Goals - 03/26/21 1558       PT SHORT TERM GOAL #1   Title Independent with initial HEP    Status Achieved               PT Long Term Goals -  04/16/21 1313       PT LONG TERM GOAL #1   Title Pt will be independent with advanced HEP    Time 6    Period Weeks    Status On-going      PT LONG TERM GOAL #2   Title Pt will demonstrate full and symmetrical shoulder ROM with </= 1/10 pain or discomfort in the shoulder    Time 6    Status On-going      PT LONG TERM GOAL #3   Title Pt will report 75% reduction in right shoulder pain with functional reaching, ADLs, and recreational wellness activities such as WC basketball    Time 6    Period Weeks    Status On-going      PT LONG TERM GOAL #4   Title Obtain shoulder FOTO and improve this by at least 12 points to demo improved shoulder function    Time 6    Period Weeks    Status On-going                   Plan - 04/16/21 1212     Clinical Impression Statement Pt reported some mms soreness today but otherwise pain seems to get a little bit less. Scap stability gradually improving. 1 instance of discomfort at 5th rep of 1st set of prone I's but this subsided with rest and completion of exercises. Continue to progress scap stabilization and strength as tolerated. Plan to reassess progress next visit, potential discharge to HEP.    PT Next Visit  Plan TE with scap stability and strengthening, upgrade resistance as tolerated next visit.    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 [place], [place], [address] Phone: [phone]   Fax:  [phone]  Name: Donald Harvey MRN: [id number] Date of Birth: 1991/03/12

## 2021-04-21 ENCOUNTER — Other Ambulatory Visit: Payer: Self-pay

## 2021-04-21 ENCOUNTER — Ambulatory Visit: Payer: Medicaid Other

## 2021-04-21 DIAGNOSIS — R293 Abnormal posture: Secondary | ICD-10-CM | POA: Diagnosis not present

## 2021-04-21 DIAGNOSIS — M25511 Pain in right shoulder: Secondary | ICD-10-CM

## 2021-04-21 DIAGNOSIS — M25611 Stiffness of right shoulder, not elsewhere classified: Secondary | ICD-10-CM

## 2021-04-21 DIAGNOSIS — M6281 Muscle weakness (generalized): Secondary | ICD-10-CM

## 2021-04-21 NOTE — Patient Instructions (Signed)
Access Code: [email or web address] URL: [email or web address] Date: 04/21/2021 Prepared by: Claude Manges  Program Notes Can use 4# DB or weight for I, Y and Ts   Exercises Seated Upper Trapezius Stretch - 1 x daily - 7 x weekly - 4 sets - 30 sec hold Seated Chest Stretch with Hands Behind Head - 1 x daily - 7 x weekly - 4 sets - 30 seconds hold Shoulder W - External Rotation with Resistance - 1 x daily - 7 x weekly - 3 sets - 10 reps Standing Shoulder Horizontal and Diagonal Pulls with Resistance - 1 x daily - 7 x weekly - 3 sets - 10 reps Scapular Stabilization - Elbow Support - 1 x daily - 7 x weekly - 3 sets - 15 reps Standard Plank - 1 x daily - 7 x weekly - 4-6 reps - 20-30 seconds hold Prone Scapular Retraction Arms at Side - 1 x daily - 7 x weekly - 2-3 sets - 15 reps Prone Scapular Retraction Y - 1 x daily - 7 x weekly - 2-3 sets - 15 reps Prone Shoulder Flexion - 1 x daily - 7 x weekly - 2-3 sets - 15 reps

## 2021-04-21 NOTE — Therapy (Signed)
[place]. [place], [place], [address] Phone: [phone]   Fax:  [phone]  Physical Therapy Treatment/ Discharge Progress Note Reporting Period 03/10/21 to 04/21/21  See note below for Objective Data and Assessment of Progress/Goals.     Patient Details  Name: Donald Harvey MRN: [id number] Date of Birth: 04-Jan-1991 Referring Provider (PT): Marchia Bond, MD   Encounter Date: 04/21/2021   PT End of Session - 04/21/21 1352     Visit Number 10    Date for PT Re-Evaluation 04/21/21    Authorization Type Healthy blue - no estim, vaso, ionto, BFB    PT Start Time 1348    PT Stop Time 1426    PT Time Calculation (min) 38 min    Activity Tolerance Patient tolerated treatment well    Behavior During Therapy WFL for tasks assessed/performed             Past Medical History:  Diagnosis Date   Broken ankle    Childhood asthma    Embolism - blood clot    Spinal paraplegia ([place])     Past Surgical History:  Procedure Laterality Date   NO PAST SURGERIES      There were no vitals filed for this visit.   Subjective Assessment - 04/21/21 1351     Subjective Everything has been doing okay. Had 1 time over weekend where thumb hit on the [facility] and he pulled arm back quickly in reaciton to it which caused brief pain in front of shoulder but went away    Patient Stated Goals To get back to where it was, arm stronger and normal    Currently in Pain? No/denies                [facility] PT Assessment - 04/21/21 0001       Assessment   Medical Diagnosis Rt Shoulder strain    Referring Provider (PT) Marchia Bond, MD    Hand Dominance Left      AROM   Overall AROM Comments R shoulder flex and ABD full and symmetrical no end range pain. IR to 65 deg, ER to 85 deg without pain FER to T6 bilaterally.                           [place] Adult PT Treatment/Exercise - 04/21/21 0001       Neck  Exercises: Machines for Strengthening   UBE (Upper Arm Bike) L4 x 3 min each      Neck Exercises: Theraband   Shoulder External Rotation --   2 sets   Horizontal ABduction 15 reps;Blue    Other Theraband Exercises blue TB Ws 2 x 15    Other Theraband Exercises blue band D2 flexion and extension x10 each RUE      Neck Exercises: Prone   Plank Forearm plank saws 10 x 3. forearm plank with protraction 30" x 5    Other Prone Exercise I Y & T  2 x 15 with 4# DBs                    PT Education - 04/21/21 1424     Education Details Updated HEP &  discharge instructions, safe independent progression of exercices at home    Person(s) Educated Patient    Methods Explanation;Demonstration;Handout    Comprehension Verbalized understanding;Returned demonstration  PT Short Term Goals - 03/26/21 1558       PT SHORT TERM GOAL #1   Title Independent with initial HEP    Status Achieved               PT Long Term Goals - 04/21/21 1357       PT LONG TERM GOAL #1   Title Pt will be independent with advanced HEP    Time 6    Period Weeks    Status Achieved      PT LONG TERM GOAL #2   Title Pt will demonstrate full and symmetrical shoulder ROM with </= 1/10 pain or discomfort in the shoulder    Time 6    Status Achieved      PT LONG TERM GOAL #3   Title Pt will report 75% reduction in right shoulder pain with functional reaching, ADLs, and recreational wellness activities such as WC basketball    Time 6    Period Weeks    Status Achieved      PT LONG TERM GOAL #4   Title Obtain shoulder FOTO and improve this by at least 12 points to demo improved shoulder function    Baseline 72% increased to 100%    Time 6    Period Weeks    Status Achieved                   Plan - 04/21/21 1428     Clinical Impression Statement Pt has met all PT goals at this time and reports full shoulder function, happy wiht current status. Spent timetoday  providing and reviewing an updated discharge HEP to maintain and safely progress shoulder stability to prevent reinjury. Pt demo's good understanding of HEP and is agreeable to discharge at this time.    PT Next Visit Plan Discharge to HEP at this time    Consulted and Agree with Plan of Care Patient             Patient will benefit from skilled therapeutic intervention in order to improve the following deficits and impairments:     Visit Diagnosis: Abnormal posture  Stiffness of right shoulder, not elsewhere classified  Acute pain of right shoulder  Muscle weakness (generalized)     Problem List Patient Active Problem List   Diagnosis Date Noted   Spastic paraparesis 05/31/2013   Spinal cord infarction ([place]) 05/31/2013   Food allergy 06/19/2012   Paraplegic spinal paralysis ([place]) 09/22/2011   Hypercoagulable state ([place]) 09/22/2011   Neurogenic bladder 09/22/2011   Neurogenic bowel 09/22/2011   PHYSICAL THERAPY DISCHARGE SUMMARY  Visits from Start of Care: 10   Plan: Patient agrees to discharge.  Patient goals were met. Patient is being discharged due to meeting the stated rehab goals.       Hall Busing, PT, DPT 04/21/2021, 2:30 PM  [place]. [place], [place], [address] Phone: [phone]   Fax:  [phone]  Name: ALIZE ACY MRN: [id number] Date of Birth: [birth year]

## 2021-06-26 IMAGING — US US SCROTUM W/ DOPPLER COMPLETE
1 series · 13 of 25 positions shown · non-contrast
Comparison: 04/13/2019

CLINICAL DATA: LEFT testicular pain and swelling for 1 month; past
history of RIGHT epididymo-orchitis

EXAM:
SCROTAL ULTRASOUND
DOPPLER ULTRASOUND OF THE TESTICLES
TECHNIQUE: Complete ultrasound examination of the testicles, epididymis, and
other scrotal structures was performed. Color and spectral Doppler
ultrasound were also utilized to evaluate blood flow to the
testicles.

[Series 1: us scrotum w/ doppler complete · 0.06mm/px · 13 of 40 slices shown]
[im 1/40]
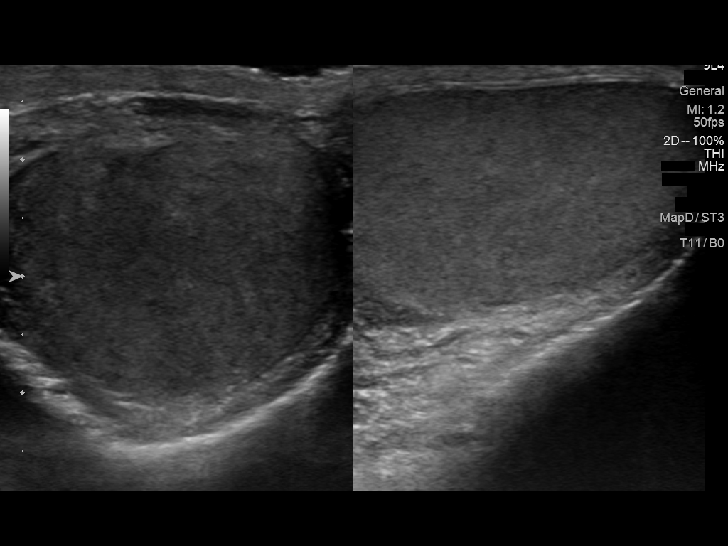
[im 4/40]
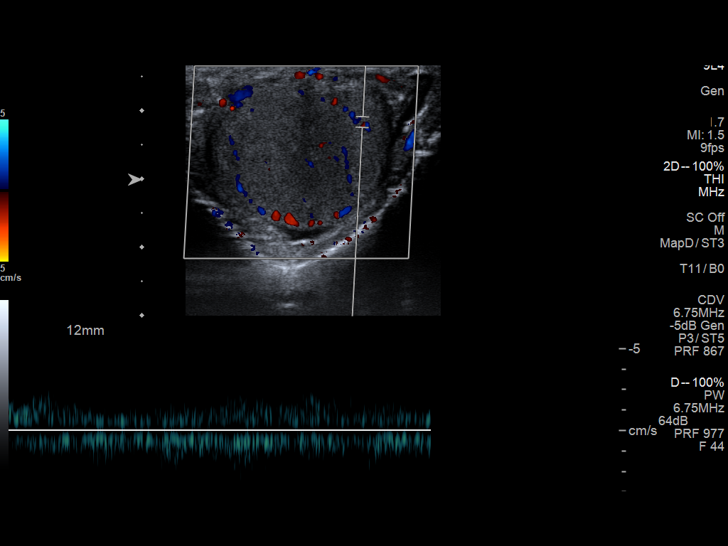
[im 7/40]
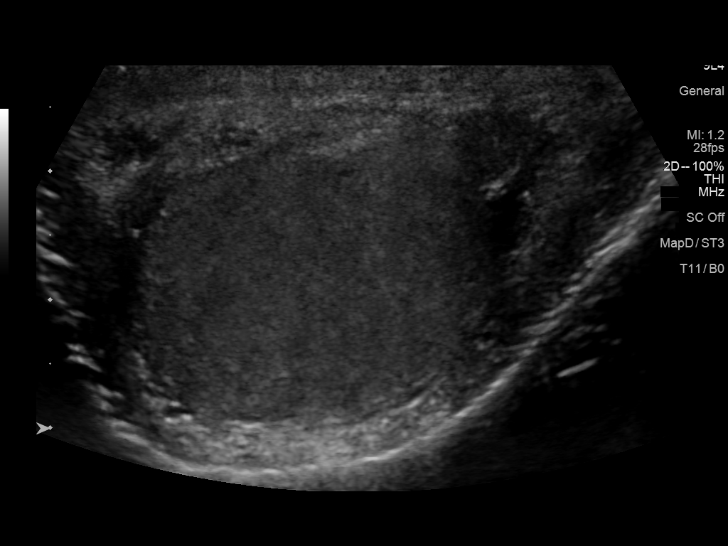
[im 10/40]
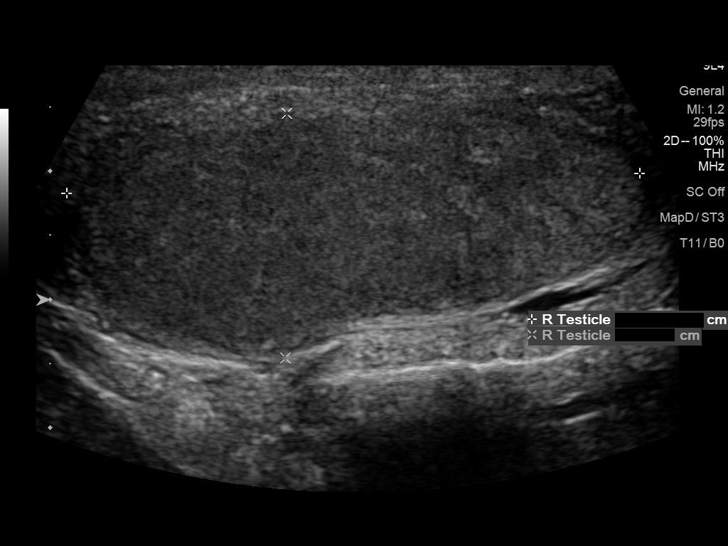
[im 14/40]
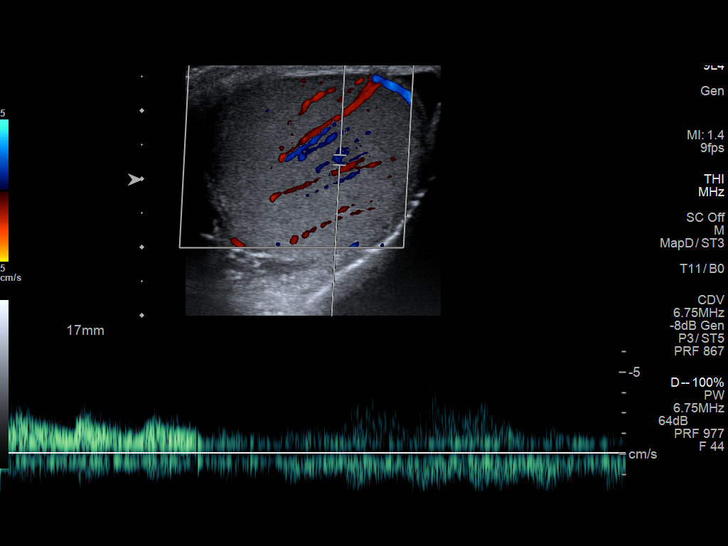
[im 17/40]
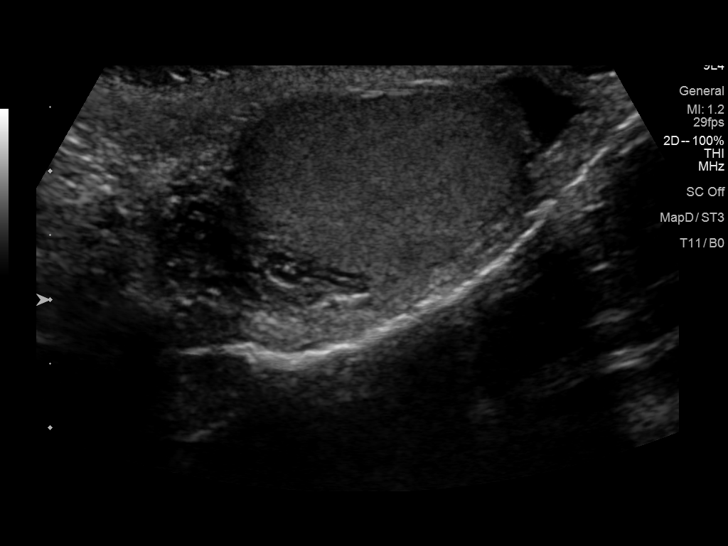
[im 20/40]
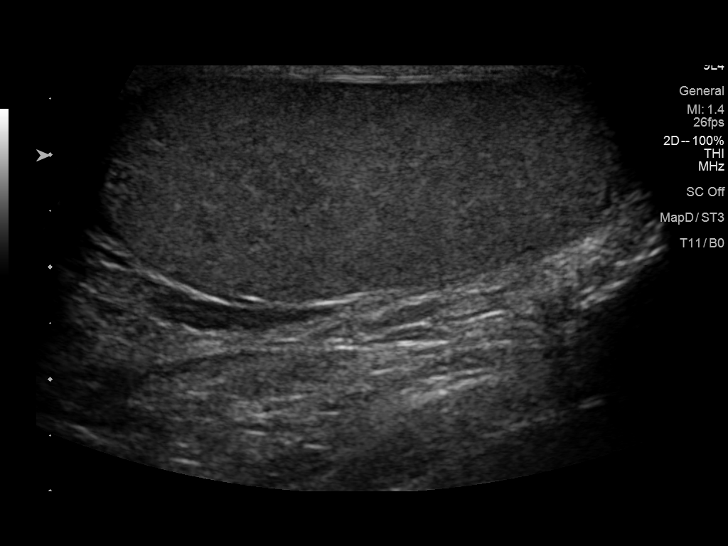
[im 23/40]
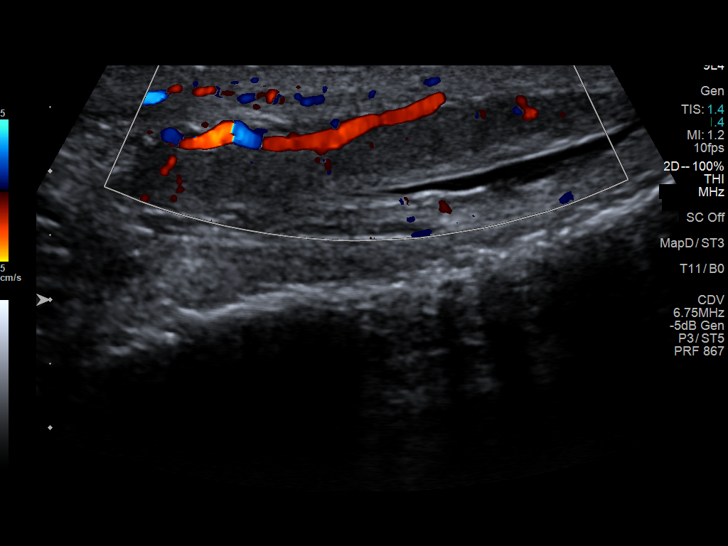
[im 27/40]
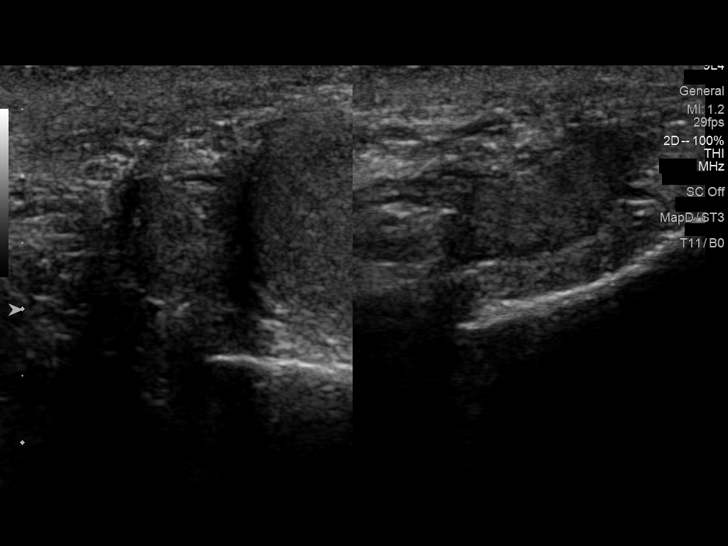
[im 30/40]
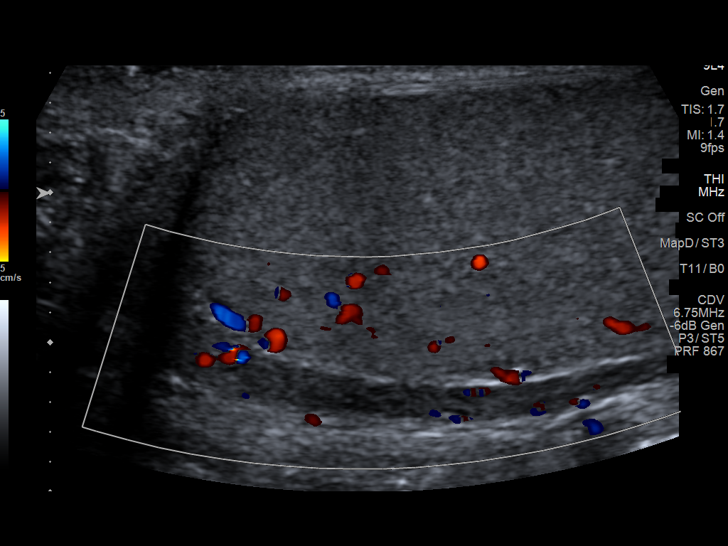
[im 33/40]
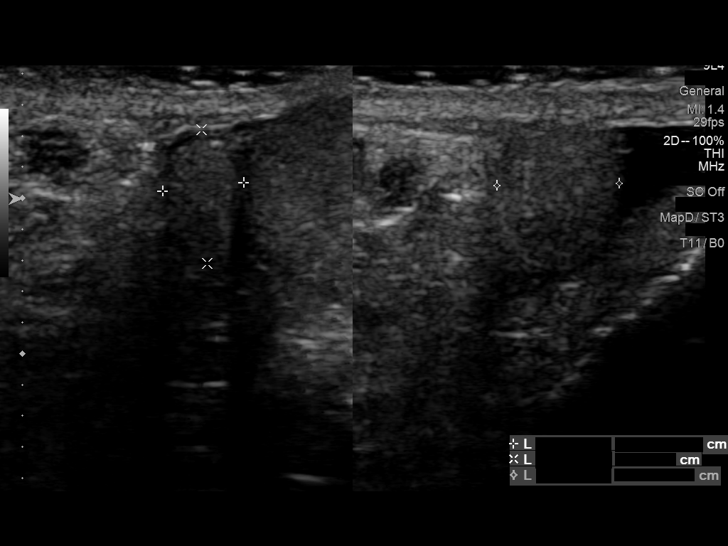
[im 36/40]
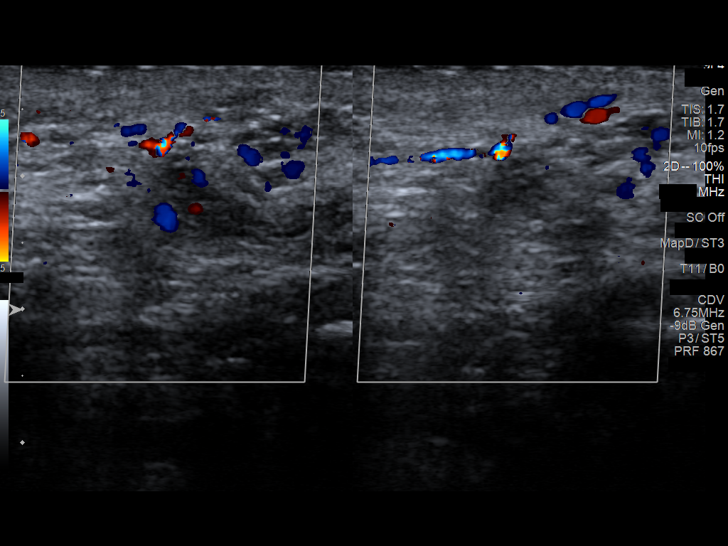
[im 40/40]
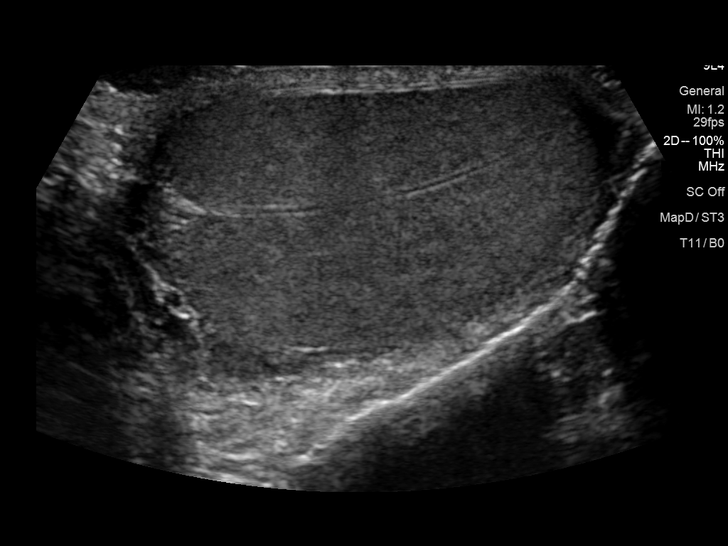

[13 of 25 positions shown; findings below may reference images not displayed]

FINDINGS: Right testicle

Measurements: 4.5 x 1.9 x 2.8 cm. Mildly inhomogeneous echogenicity
and decrease in size since previous exam when it measured 4.8 x
x 3.2 cm; this is consistent with sequela of prior RIGHT orchitis.
No evidence of testicular mass or microcalcification.

Left testicle

Measurements: 4.6 x 2.1 x 3.6 cm. Normal echogenicity without mass
or calcification. Internal blood flow present on color Doppler
imaging.

Right epididymis:  Normal in size and appearance.

Left epididymis:  Normal in size and appearance.

Hydrocele:  Trace RIGHT hydrocele.

Varicocele:  None visualized.

Pulsed Doppler interrogation of both testes demonstrates normal low
resistance arterial and venous waveforms bilaterally.
IMPRESSION: Interval decrease in size and mildly inhomogeneous echogenicity of
RIGHT testis since prior study consistent with sequela of prior
orchitis.

Otherwise normal exam.

Specifically, no LEFT scrotal abnormalities identified.

## 2021-08-28 ENCOUNTER — Telehealth: Payer: Self-pay | Admitting: Hematology and Oncology

## 2021-08-28 NOTE — Telephone Encounter (Signed)
Scheduled appt per 10/27 referral. Pt is aware of appt date and time.  

## 2021-09-10 ENCOUNTER — Telehealth: Payer: Self-pay | Admitting: Hematology and Oncology

## 2021-09-10 NOTE — Telephone Encounter (Signed)
Pt called in and left a vm requesting to r/s his new hem appt with Dr. Leonides Schanz. I called the pt and r/s his appt per his request. Pt is aware of new appt date and time.

## 2021-09-11 ENCOUNTER — Inpatient Hospital Stay: Payer: Medicaid Other

## 2021-09-11 ENCOUNTER — Inpatient Hospital Stay: Payer: Medicaid Other | Admitting: Hematology and Oncology

## 2021-10-01 ENCOUNTER — Inpatient Hospital Stay: Payer: Medicaid Other | Attending: Hematology and Oncology

## 2021-10-01 ENCOUNTER — Inpatient Hospital Stay: Payer: Medicaid Other | Admitting: Hematology and Oncology

## 2021-10-01 ENCOUNTER — Other Ambulatory Visit: Payer: Self-pay

## 2021-10-01 VITALS — BP 137/72 | HR 71 | Temp 97.3°F | Resp 18

## 2021-10-01 DIAGNOSIS — D72818 Other decreased white blood cell count: Secondary | ICD-10-CM | POA: Diagnosis not present

## 2021-10-01 DIAGNOSIS — G8220 Paraplegia, unspecified: Secondary | ICD-10-CM | POA: Diagnosis not present

## 2021-10-01 DIAGNOSIS — D72828 Other elevated white blood cell count: Secondary | ICD-10-CM | POA: Diagnosis present

## 2021-10-01 LAB — CMP (CANCER CENTER ONLY)
ALT: 15 U/L (ref 0–44)
AST: 16 U/L (ref 15–41)
Albumin: 4.4 g/dL (ref 3.5–5.0)
Alkaline Phosphatase: 63 U/L (ref 38–126)
Anion gap: 8 (ref 5–15)
BUN: 12 mg/dL (ref 6–20)
CO2: 25 mmol/L (ref 22–32)
Calcium: 9.3 mg/dL (ref 8.9–10.3)
Chloride: 106 mmol/L (ref 98–111)
Creatinine: 0.94 mg/dL (ref 0.61–1.24)
GFR, Estimated: >60 mL/min (ref >60)
Glucose, Bld: 77 mg/dL (ref 70–99)
Potassium: 3.9 mmol/L (ref 3.5–5.1)
Sodium: 139 mmol/L (ref 135–145)
Total Bilirubin: 0.9 mg/dL (ref 0.3–1.2)
Total Protein: 7.6 g/dL (ref 6.5–8.1)

## 2021-10-01 LAB — HEPATITIS C ANTIBODY: HCV Ab: NONREACTIVE

## 2021-10-01 LAB — HEPATITIS B SURFACE ANTIBODY,QUALITATIVE: Hep B S Ab: NONREACTIVE

## 2021-10-01 LAB — CBC WITH DIFFERENTIAL (CANCER CENTER ONLY)
Abs Immature Granulocytes: 0 10*3/uL (ref 0.00–0.07)
Basophils Absolute: 0 10*3/uL (ref 0.0–0.1)
Basophils Relative: 1 %
Eosinophils Absolute: 0.1 10*3/uL (ref 0.0–0.5)
Eosinophils Relative: 2 %
HCT: 45.9 % (ref 39.0–52.0)
Hemoglobin: 15.7 g/dL (ref 13.0–17.0)
Immature Granulocytes: 0 %
Lymphocytes Relative: 34 %
Lymphs Abs: 1 10*3/uL (ref 0.7–4.0)
MCH: 31.3 pg (ref 26.0–34.0)
MCHC: 34.2 g/dL (ref 30.0–36.0)
MCV: 91.4 fL (ref 80.0–100.0)
Monocytes Absolute: 0.3 10*3/uL (ref 0.1–1.0)
Monocytes Relative: 9 %
Neutro Abs: 1.5 10*3/uL — ABNORMAL LOW (ref 1.7–7.7)
Neutrophils Relative %: 54 %
Platelet Count: 249 10*3/uL (ref 150–400)
RBC: 5.02 MIL/uL (ref 4.22–5.81)
RDW: 12.3 % (ref 11.5–15.5)
WBC Count: 2.8 10*3/uL — ABNORMAL LOW (ref 4.0–10.5)
nRBC: 0 % (ref 0.0–0.2)

## 2021-10-01 LAB — HEPATITIS B CORE ANTIBODY, TOTAL: Hep B Core Total Ab: NONREACTIVE

## 2021-10-01 LAB — HIV ANTIBODY (ROUTINE TESTING W REFLEX): HIV Screen 4th Generation wRfx: NONREACTIVE

## 2021-10-01 LAB — HEPATITIS B SURFACE ANTIGEN: Hepatitis B Surface Ag: NONREACTIVE

## 2021-10-01 LAB — FOLATE: Folate: 14.8 ng/mL (ref >5.9)

## 2021-10-01 LAB — VITAMIN B12: Vitamin B-12: 541 pg/mL (ref 180–914)

## 2021-10-01 LAB — LACTATE DEHYDROGENASE: LDH: 100 U/L (ref 98–192)

## 2021-10-01 NOTE — Progress Notes (Signed)
[place] [phone]   Fax:(336) [place] NOTE  Patient Care Team: Marletta Lor, MD (Inactive) as PCP - General (Internal Medicine)  Hematological/Oncological History # Leukopenia 07/17/2021: Hgb 16.5, MCV 93.6, Plt 217, WBC (unreadable on report), [place] 2139, ALC 911 08/15/2021: WBC 2.9, Hgb 15.4, MCV 94.7, Plt 213, ANC 1618, ALC 969 10/01/2021: establish care with Dr. Lorenso Courier   CHIEF COMPLAINTS/PURPOSE OF CONSULTATION:  "Leukopenia "  HISTORY OF PRESENTING ILLNESS:  Donald Harvey 30 y.o. 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   On review of the previous records Mr. Eifler was noted to have a white blood cell count of 2.9, hemoglobin 15.4, MCV of 94.7, and platelets of 213 with an ANC of 1618 and ALC of 969. Due to concern for this leukopenia he was referred to hematology for further evaluation and management.   On exam today Mr. Parke reports that he is not prone to recurrent infections.  He does rarely have a hearing tract infection for which she drinks water and eats blueberries.  He does have a catheter in place for urine.  He notes that he is not having any issues with fevers, chills, sweats.  He notes that he recently restarted a multivitamin which she stopped several years ago.  He notes that he tends to watch his vitamin K intake as he is concerned about clotting issues but otherwise has an unrestricted diet.  He eats red meat.  On further discussion he notes that he is a never smoker and only occasionally drinks alcohol.  He notes that he does not currently work and lives at home with his mother.  His family history is remarkable for blood clots on his father side of the family including a blood clot in the leg of a paternal aunt.  He notes his mom is otherwise healthy and he has healthy siblings.  He currently denies any weight loss,  low appetite, pain, fevers, chills, sweats, nausea, vomiting or diarrhea.  A full 10 point ROS is listed below.  MEDICAL HISTORY:  Past Medical History:  Diagnosis Date   Broken ankle    Childhood asthma    Embolism - blood clot    Spinal paraplegia (HCC)     SURGICAL HISTORY: Past Surgical History:  Procedure Laterality Date   NO PAST SURGERIES      SOCIAL HISTORY: Social History   Socioeconomic History   Marital status: Single    Spouse name: Not on file   Number of children: 0   Years of education: 16.5    Highest education level: Not on file  Occupational History   Occupation: Unemployed  Tobacco Use   Smoking status: Never   Smokeless tobacco: Never  Substance and Sexual Activity   Alcohol use: Yes    Comment: [place]   Drug use: No   Sexual activity: Not on file  Other Topics Concern   Not on file  Social History Narrative   Lives w/ mother   Caffeine use: Tea/soda- couple times per month   Social Determinants of Health   Financial Resource Strain: Not on file  Food Insecurity: Not on file  Transportation Needs: Not on file  Physical Activity: Not on file  Stress: Not on file  Social Connections: Not on file  Intimate Partner Violence: Not on file    FAMILY HISTORY: No family history on file.  ALLERGIES:  is  allergic to shellfish allergy.  MEDICATIONS:  Current Outpatient Medications  Medication Sig Dispense Refill   Multiple Vitamin (MULTIVITAMIN) tablet Take 1 tablet by mouth daily.     No current facility-administered medications for this visit.    REVIEW OF SYSTEMS:   Constitutional: ( - ) fevers, ( - )  chills , ( - ) night sweats Eyes: ( - ) blurriness of vision, ( - ) double vision, ( - ) watery eyes Ears, nose, mouth, throat, and face: ( - ) mucositis, ( - ) sore throat Respiratory: ( - ) cough, ( - ) dyspnea, ( - ) wheezes Cardiovascular: ( - ) palpitation, ( - ) chest discomfort, ( - ) lower extremity swelling Gastrointestinal:  (  - ) nausea, ( - ) heartburn, ( - ) change in bowel habits Skin: ( - ) abnormal skin rashes Lymphatics: ( - ) new lymphadenopathy, ( - ) easy bruising Neurological: ( - ) numbness, ( - ) tingling, ( - ) new weaknesses Behavioral/Psych: ( - ) mood change, ( - ) new changes  All other systems were reviewed with the patient and are negative.  PHYSICAL EXAMINATION: Vitals:   10/01/21 1402  BP: 137/72  Pulse: 71  Resp: 18  Temp: (!) 97.3 F (36.3 C)  SpO2: 100%   Filed Weights    GENERAL: well appearing young African American male in NAD  SKIN: skin color, texture, turgor are normal, no rashes or significant lesions EYES: conjunctiva are pink and non-injected, sclera clear LUNGS: clear to auscultation and percussion with normal breathing effort HEART: regular rate & rhythm and no murmurs and no lower extremity edema Musculoskeletal: no cyanosis of digits and no clubbing  PSYCH: alert & oriented x 3, fluent speech NEURO: Patient wheelchair bound, can walk with brace and walker  LABORATORY DATA:  I have reviewed the data as listed CBC Latest Ref Rng & Units 10/01/2021 04/13/2019 01/03/2016  WBC 4.0 - 10.5 K/uL 2.8(L) 15.1(H) 5.0  Hemoglobin 13.0 - 17.0 g/dL 15.7 16.8 15.6  Hematocrit 39.0 - 52.0 % 45.9 48.9 45.7  Platelets 150 - 400 K/uL 249 213 248.0    CMP Latest Ref Rng & Units 10/01/2021 04/13/2019 01/03/2016  Glucose 70 - 99 mg/dL 77 108(H) 104(H)  BUN 6 - 20 mg/dL $Remove'12 13 10  'lcOANPP$ Creatinine 0.61 - 1.24 mg/dL 0.94 1.44(H) 1.11  Sodium 135 - 145 mmol/L 139 135 143  Potassium 3.5 - 5.1 mmol/L 3.9 3.4(L) 4.3  Chloride 98 - 111 mmol/L 106 100 106  CO2 22 - 32 mmol/L 25 20(L) 30  Calcium 8.9 - 10.3 mg/dL 9.3 9.1 9.4  Total Protein 6.5 - 8.1 g/dL 7.6 - 7.0  Total Bilirubin 0.3 - 1.2 mg/dL 0.9 - 0.4  Alkaline Phos 38 - 126 U/L 63 - 59  AST 15 - 41 U/L 16 - 21  ALT 0 - 44 U/L 15 - 21    RADIOGRAPHIC STUDIES: No results found.  ASSESSMENT & PLAN [place] POSTLEWAITE 30 y.o. 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   After review of the labs, review of the records, and discussion with the patient the patients findings are most consistent with a leukopenia of unclear etiology.  At this time the differential is broad includes viral etiologies, nutritional deficiencies, versus benign ethnic neutropenia.  Interestingly at this time the patient has normal individual counts of neutrophils, lymphocytes, and other white blood cells.  He does not appear to have frank neutropenia.  He also does not have any signs or symptoms concerning for recurrent infections or immunosuppression.  In the event no clear etiology can be found would suggest that this is a congenital issue and we will have the patient return to clinic in 6 months time.  # Leukopenia, unclear etiology -- Differential is broad but includes viral etiologies, nutritional deficiencies, versus benign ethnic neutropenia. --We will order hepatitis B, hepatitis C, and HIV serologies --Nutritional evaluation to include vitamin B12 and folate --No clear indication for a bone marrow biopsy at this time --Patient return to clinic in 6 months time or sooner if he were to develop new or worsening symptoms or of a clear etiology was noted in the above workup.  Orders Placed This Encounter  Procedures   CBC with Differential ([facility] Only)    Standing Status:   Future    Number of Occurrences:   1    Standing Expiration Date:   10/01/2022   CMP ([facility] only)    Standing Status:   Future    Number of Occurrences:   1    Standing Expiration Date:   10/01/2022   Lactate dehydrogenase (LDH)    Standing Status:   Future    Number of Occurrences:   1    Standing Expiration Date:   10/01/2022   Folate, Serum    Standing Status:   Future    Number of Occurrences:   1    Standing Expiration Date:   10/01/2022   Vitamin B12    Standing  Status:   Future    Number of Occurrences:   1    Standing Expiration Date:   10/01/2022   Hepatitis C antibody    Standing Status:   Future    Number of Occurrences:   1    Standing Expiration Date:   10/01/2022   Hepatitis B surface antibody    Standing Status:   Future    Number of Occurrences:   1    Standing Expiration Date:   10/01/2022   Hepatitis B surface antigen    Standing Status:   Future    Number of Occurrences:   1    Standing Expiration Date:   10/01/2022   Hepatitis B core antibody, total    Standing Status:   Future    Number of Occurrences:   1    Standing Expiration Date:   10/01/2022   HIV antibody (with reflex)    Standing Status:   Future    Number of Occurrences:   1    Standing Expiration Date:   10/01/2022    All questions were answered. The patient knows to call the clinic with any problems, questions or concerns.  A total of more than 60 minutes were spent on this encounter with face-to-face time and non-face-to-face time, including preparing to see the patient, ordering tests and/or medications, counseling the patient and coordination of care as outlined above.   Ledell Peoples, MD Department of Hematology/Oncology [place] [facility] Phone: [phone] Pager: [phone] Email: [email or web address]  10/02/2021 10:31 AM

## 2021-10-03 ENCOUNTER — Telehealth: Payer: Self-pay | Admitting: *Deleted

## 2021-10-03 NOTE — Telephone Encounter (Signed)
TCT patient regarding recent lab results.  Spoke with him and advised that the results did not show a clear reason for his neutropenia. 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 Pt voiced understanding of this. Made him aware of his appt in 6 months.

## 2021-10-03 NOTE — Telephone Encounter (Signed)
-----   Message from Jaci Standard, MD sent at 10/02/2021 10:32 AM EST ----- Please let Mr. Donald Harvey know that our extensive work-up did not find a clear etiology for his low white blood cell count.  There was no evidence of any viruses, nutritional deficiencies, or other problems with his blood.  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  We will plan to see the patient back in 6 months time unless he were to have new or worsening symptoms.  ----- Message ----- From: Leory Plowman, Lab In [place] Sent: 10/01/2021   3:05 PM EST To: Jaci Standard, MD

## 2022-04-02 ENCOUNTER — Inpatient Hospital Stay (HOSPITAL_BASED_OUTPATIENT_CLINIC_OR_DEPARTMENT_OTHER): Payer: Medicaid Other | Admitting: Hematology and Oncology

## 2022-04-02 ENCOUNTER — Other Ambulatory Visit: Payer: Self-pay

## 2022-04-02 ENCOUNTER — Other Ambulatory Visit: Payer: Self-pay | Admitting: Hematology and Oncology

## 2022-04-02 ENCOUNTER — Inpatient Hospital Stay: Payer: Medicaid Other | Attending: Hematology and Oncology

## 2022-04-02 VITALS — BP 123/70 | HR 65 | Temp 98.5°F | Resp 18 | Ht 75.0 in | Wt 199.4 lb

## 2022-04-02 DIAGNOSIS — D72819 Decreased white blood cell count, unspecified: Secondary | ICD-10-CM | POA: Insufficient documentation

## 2022-04-02 DIAGNOSIS — Z86718 Personal history of other venous thrombosis and embolism: Secondary | ICD-10-CM | POA: Diagnosis not present

## 2022-04-02 DIAGNOSIS — D72818 Other decreased white blood cell count: Secondary | ICD-10-CM

## 2022-04-02 LAB — CBC WITH DIFFERENTIAL (CANCER CENTER ONLY)
Abs Immature Granulocytes: 0.01 10*3/uL (ref 0.00–0.07)
Basophils Absolute: 0 10*3/uL (ref 0.0–0.1)
Basophils Relative: 1 %
Eosinophils Absolute: 0.1 10*3/uL (ref 0.0–0.5)
Eosinophils Relative: 2 %
HCT: 44.6 % (ref 39.0–52.0)
Hemoglobin: 15.7 g/dL (ref 13.0–17.0)
Immature Granulocytes: 0 %
Lymphocytes Relative: 23 %
Lymphs Abs: 0.9 10*3/uL (ref 0.7–4.0)
MCH: 32 pg (ref 26.0–34.0)
MCHC: 35.2 g/dL (ref 30.0–36.0)
MCV: 91 fL (ref 80.0–100.0)
Monocytes Absolute: 0.3 10*3/uL (ref 0.1–1.0)
Monocytes Relative: 7 %
Neutro Abs: 2.6 10*3/uL (ref 1.7–7.7)
Neutrophils Relative %: 67 %
Platelet Count: 217 10*3/uL (ref 150–400)
RBC: 4.9 MIL/uL (ref 4.22–5.81)
RDW: 12.3 % (ref 11.5–15.5)
WBC Count: 3.8 10*3/uL — ABNORMAL LOW (ref 4.0–10.5)
nRBC: 0 % (ref 0.0–0.2)

## 2022-04-02 LAB — CMP (CANCER CENTER ONLY)
ALT: 16 U/L (ref 0–44)
AST: 14 U/L — ABNORMAL LOW (ref 15–41)
Albumin: 4.4 g/dL (ref 3.5–5.0)
Alkaline Phosphatase: 48 U/L (ref 38–126)
Anion gap: 5 (ref 5–15)
BUN: 12 mg/dL (ref 6–20)
CO2: 31 mmol/L (ref 22–32)
Calcium: 9.4 mg/dL (ref 8.9–10.3)
Chloride: 104 mmol/L (ref 98–111)
Creatinine: 1.05 mg/dL (ref 0.61–1.24)
GFR, Estimated: >60 mL/min (ref >60)
Glucose, Bld: 97 mg/dL (ref 70–99)
Potassium: 4 mmol/L (ref 3.5–5.1)
Sodium: 140 mmol/L (ref 135–145)
Total Bilirubin: 0.8 mg/dL (ref 0.3–1.2)
Total Protein: 7 g/dL (ref 6.5–8.1)

## 2022-04-02 LAB — TSH: TSH: 3.163 u[IU]/mL (ref 0.350–4.500)

## 2022-04-02 NOTE — Progress Notes (Signed)
[facility] [phone]   [phone]  PROGRESS NOTE  Patient Care Team: Gordy Savers, MD (Inactive) as PCP - General (Internal Medicine)  Hematological/Oncological History # Leukopenia 07/17/2021: Hgb 16.5, MCV 93.6, Plt 217, WBC (unreadable on report), ANC 2139, ALC 911 08/15/2021: WBC 2.9, Hgb 15.4, MCV 94.7, Plt 213, ANC 1618, ALC 969 10/01/2021: establish care with Dr. Leonides Schanz   Interval History:  Donald Harvey 31 y.o. male with medical history significant for leukopenia who presents for a follow up visit. The patient's last visit was on 10/01/2021. In the interim since the last visit he has had no major changes in his health   On exam today Donald Harvey reports has been well in the interim since her last visit.  He has had no issues with recent infections or any antibiotic courses.  He has felt well with no changes in his energy and his weight has been stable.  He denies any changes in PMH and medication regimen. He does not have any questions or concerns at todays visit. He is negative for fatigue, fever, chills, recent and frequent infections, chest pain, SOB, and edema.  MEDICAL HISTORY:  Past Medical History:  Diagnosis Date   Broken ankle    Childhood asthma    Embolism - blood clot    Spinal paraplegia (HCC)     SURGICAL HISTORY: Past Surgical History:  Procedure Laterality Date   NO PAST SURGERIES      SOCIAL HISTORY: Social History   Socioeconomic History   Marital status: Single    Spouse name: Not on file   Number of children: 0   Years of education: 16.5    Highest education level: Not on file  Occupational History   Occupation: Unemployed  Tobacco Use   Smoking status: Never   Smokeless tobacco: Never  Substance and Sexual Activity   Alcohol use: Yes    Comment: [place]   Drug use: No   Sexual activity: Not on file  Other Topics Concern   Not on file  Social History Narrative   Lives w/ mother    Caffeine use: Tea/soda- couple times per month   Social Determinants of Health   Financial Resource Strain: Not on file  Food Insecurity: Not on file  Transportation Needs: Not on file  Physical Activity: Not on file  Stress: Not on file  Social Connections: Not on file  Intimate Partner Violence: Not on file    FAMILY HISTORY: No family history on file.  ALLERGIES:  is allergic to shellfish allergy.  MEDICATIONS:  Current Outpatient Medications  Medication Sig Dispense Refill   Multiple Vitamin (MULTIVITAMIN) tablet Take 1 tablet by mouth daily.     No current facility-administered medications for this visit.    REVIEW OF SYSTEMS:   Constitutional: ( - ) fevers, ( - )  chills , ( - ) night sweats Eyes: ( - ) blurriness of vision, ( - ) double vision, ( - ) watery eyes Ears, nose, mouth, throat, and face: ( - ) mucositis, ( - ) sore throat Respiratory: ( - ) cough, ( - ) dyspnea, ( - ) wheezes Cardiovascular: ( - ) palpitation, ( - ) chest discomfort, ( - ) lower extremity swelling Gastrointestinal:  ( - ) nausea, ( - ) heartburn, ( - ) change in bowel habits Skin: ( - ) abnormal skin rashes Lymphatics: ( - ) new lymphadenopathy, ( - ) easy bruising Neurological: ( - ) numbness, ( - )  tingling, ( - ) new weaknesses Behavioral/Psych: ( - ) mood change, ( - ) new changes  All other systems were reviewed with the patient and are negative.  PHYSICAL EXAMINATION:  Vitals:   04/02/22 1058  BP: 123/70  Pulse: 65  Resp: 18  Temp: 98.5 F (36.9 C)  SpO2: 100%   Filed Weights   04/02/22 1058  Weight: 199 lb 6.4 oz (90.4 kg)    GENERAL: Well-appearing middle-aged African-American male.  In a wheelchair.  Alert, no distress and comfortable SKIN: skin color, texture, turgor are normal, no rashes or significant lesions EYES: conjunctiva are pink and non-injected, sclera clear LUNGS: clear to auscultation and percussion with normal breathing effort HEART: regular rate &  rhythm and no murmurs and no lower extremity edema Musculoskeletal: no cyanosis of digits and no clubbing  PSYCH: alert & oriented x 3, fluent speech NEURO: no focal motor/sensory deficits  LABORATORY DATA:  I have reviewed the data as listed    Latest Ref Rng & Units 04/02/2022   10:22 AM 10/01/2021    2:49 PM 04/13/2019    7:47 PM  CBC  WBC 4.0 - 10.5 K/uL 3.8   2.8   15.1    Hemoglobin 13.0 - 17.0 g/dL 51.7   61.6   07.3    Hematocrit 39.0 - 52.0 % 44.6   45.9   48.9    Platelets 150 - 400 K/uL 217   249   213         Latest Ref Rng & Units 04/02/2022   10:22 AM 10/01/2021    2:49 PM 04/13/2019    7:47 PM  CMP  Glucose 70 - 99 mg/dL 97   77   710    BUN 6 - 20 mg/dL 12   12   13     Creatinine 0.61 - 1.24 mg/dL   6.26   9.48    Sodium 135 - 145 mmol/L 140   139   135    Potassium 3.5 - 5.1 mmol/L 4.0   3.9   3.4    Chloride 98 - 111 mmol/L 104   106   100    CO2 22 - 32 mmol/L 31   25   20     Calcium 8.9 - 10.3 mg/dL 9.4   9.3   9.1    Total Protein 6.5 - 8.1 g/dL 7.0   7.6     Total Bilirubin 0.3 - 1.2 mg/dL 0.8   0.9     Alkaline Phos 38 - 126 U/L 48   63     AST 15 - 41 U/L 14   16     ALT 0 - 44 U/L 16   15      RADIOGRAPHIC STUDIES: No results found.  ASSESSMENT & PLAN Donald Harvey 31 y.o. male with medical history significant for leukopenia who presents for a follow up visit.  # Leukopenia, unclear etiology -- Work-up found no evidence of viral etiology.  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  Patient can be referred back to the clinic if he is having worsening of blood counts, drop in his other counts, or problems with infection  No orders of the defined types were placed in  this encounter.   All questions were answered. The patient knows to call the clinic with any problems, questions or concerns.  A total of more than 25 minutes were spent on this encounter with face-to-face time and non-face-to-face time, including preparing to see the patient, ordering tests and/or medications, counseling the patient and coordination of care as outlined above.   Ulysees BarnsJohn T. Lateria Alderman, MD Department of Hematology/Oncology [facility] Phone: [phone] Pager: [phone] Email: [email or web address]  04/07/2022 1:42 PM

## 2023-07-06 ENCOUNTER — Telehealth: Payer: Self-pay | Admitting: Hematology and Oncology

## 2023-07-06 NOTE — Telephone Encounter (Signed)
called patient again per 8/26 staff message to schedule follow up. Patient stated that he talked with PCP and will continue to follow up with them.
